# Patient Record
Sex: Female | Born: 1980 | Race: Black or African American | Hispanic: No | Marital: Single | Smoking: Never smoker
Health system: Southern US, Community
[De-identification: ages and names within clinical notes are randomized; demographics above are authoritative.]

## PROBLEM LIST (undated history)

## (undated) DIAGNOSIS — B977 Papillomavirus as the cause of diseases classified elsewhere: Secondary | ICD-10-CM

## (undated) DIAGNOSIS — A6000 Herpesviral infection of urogenital system, unspecified: Secondary | ICD-10-CM

## (undated) DIAGNOSIS — D649 Anemia, unspecified: Secondary | ICD-10-CM

## (undated) HISTORY — DX: Herpesviral infection of urogenital system, unspecified: A60.00

## (undated) HISTORY — DX: Papillomavirus as the cause of diseases classified elsewhere: B97.7

---

## 2013-10-24 ENCOUNTER — Ambulatory Visit: Payer: Self-pay | Admitting: Obstetrics and Gynecology

## 2013-10-24 LAB — CBC WITH DIFFERENTIAL/PLATELET
BASOS ABS: 0.1 10*3/uL (ref 0.0–0.1)
BASOS PCT: 1.2 %
EOS PCT: 1 %
Eosinophil #: 0.1 10*3/uL (ref 0.0–0.7)
HCT: 34.3 % — ABNORMAL LOW (ref 35.0–47.0)
HGB: 11.8 g/dL — AB (ref 12.0–16.0)
Lymphocyte #: 1.8 10*3/uL (ref 1.0–3.6)
Lymphocyte %: 22 %
MCH: 32.6 pg (ref 26.0–34.0)
MCHC: 34.4 g/dL (ref 32.0–36.0)
MCV: 95 fL (ref 80–100)
Monocyte #: 0.4 x10 3/mm (ref 0.2–0.9)
Monocyte %: 5.3 %
Neutrophil #: 5.9 10*3/uL (ref 1.4–6.5)
Neutrophil %: 70.5 %
Platelet: 274 10*3/uL (ref 150–440)
RBC: 3.62 10*6/uL — ABNORMAL LOW (ref 3.80–5.20)
RDW: 14.2 % (ref 11.5–14.5)
WBC: 8.4 10*3/uL (ref 3.6–11.0)

## 2013-10-25 ENCOUNTER — Inpatient Hospital Stay: Payer: Self-pay | Admitting: Obstetrics & Gynecology

## 2013-10-26 LAB — HEMATOCRIT: HCT: 31 % — ABNORMAL LOW (ref 35.0–47.0)

## 2013-10-28 LAB — PATHOLOGY REPORT

## 2013-11-20 ENCOUNTER — Emergency Department: Payer: Self-pay

## 2013-11-20 DIAGNOSIS — B977 Papillomavirus as the cause of diseases classified elsewhere: Secondary | ICD-10-CM

## 2013-11-20 HISTORY — DX: Papillomavirus as the cause of diseases classified elsewhere: B97.7

## 2013-11-20 LAB — CBC WITH DIFFERENTIAL/PLATELET
Basophil #: 0.1 10*3/uL (ref 0.0–0.1)
Basophil %: 0.6 %
EOS PCT: 0.4 %
Eosinophil #: 0 10*3/uL (ref 0.0–0.7)
HCT: 35.2 % (ref 35.0–47.0)
HGB: 11.7 g/dL — ABNORMAL LOW (ref 12.0–16.0)
LYMPHS ABS: 1 10*3/uL (ref 1.0–3.6)
LYMPHS PCT: 10.3 %
MCH: 31.3 pg (ref 26.0–34.0)
MCHC: 33.3 g/dL (ref 32.0–36.0)
MCV: 94 fL (ref 80–100)
Monocyte #: 0.3 x10 3/mm (ref 0.2–0.9)
Monocyte %: 2.7 %
NEUTROS PCT: 86 %
Neutrophil #: 8.5 10*3/uL — ABNORMAL HIGH (ref 1.4–6.5)
Platelet: 358 10*3/uL (ref 150–440)
RBC: 3.74 10*6/uL — AB (ref 3.80–5.20)
RDW: 13.5 % (ref 11.5–14.5)
WBC: 9.9 10*3/uL (ref 3.6–11.0)

## 2013-11-20 LAB — URINALYSIS, COMPLETE
Bacteria: NONE SEEN
Bilirubin,UR: NEGATIVE
GLUCOSE, UR: NEGATIVE mg/dL (ref 0–75)
NITRITE: NEGATIVE
PH: 7 (ref 4.5–8.0)
Protein: NEGATIVE
RBC,UR: 5 /HPF (ref 0–5)
Specific Gravity: 1.017 (ref 1.003–1.030)
Squamous Epithelial: <1

## 2013-11-20 LAB — COMPREHENSIVE METABOLIC PANEL
ALT: 22 U/L (ref 12–78)
ANION GAP: 10 (ref 7–16)
Albumin: 3.9 g/dL (ref 3.4–5.0)
Alkaline Phosphatase: 76 U/L
BILIRUBIN TOTAL: 0.4 mg/dL (ref 0.2–1.0)
BUN: 5 mg/dL — ABNORMAL LOW (ref 7–18)
CALCIUM: 9.2 mg/dL (ref 8.5–10.1)
CO2: 22 mmol/L (ref 21–32)
CREATININE: 0.77 mg/dL (ref 0.60–1.30)
Chloride: 109 mmol/L — ABNORMAL HIGH (ref 98–107)
EGFR (Non-African Amer.): >60
GLUCOSE: 88 mg/dL (ref 65–99)
OSMOLALITY: 278 (ref 275–301)
Potassium: 3.6 mmol/L (ref 3.5–5.1)
SGOT(AST): 21 U/L (ref 15–37)
Sodium: 141 mmol/L (ref 136–145)
TOTAL PROTEIN: 8.1 g/dL (ref 6.4–8.2)

## 2013-11-20 LAB — LIPASE, BLOOD: Lipase: 116 U/L (ref 73–393)

## 2014-01-02 ENCOUNTER — Emergency Department: Payer: Self-pay | Admitting: Emergency Medicine

## 2014-01-02 LAB — COMPREHENSIVE METABOLIC PANEL
ALBUMIN: 4.5 g/dL (ref 3.4–5.0)
ALK PHOS: 76 U/L
AST: 24 U/L (ref 15–37)
Anion Gap: 12 (ref 7–16)
BUN: 4 mg/dL — AB (ref 7–18)
Bilirubin,Total: 0.7 mg/dL (ref 0.2–1.0)
Calcium, Total: 9.7 mg/dL (ref 8.5–10.1)
Chloride: 103 mmol/L (ref 98–107)
Co2: 24 mmol/L (ref 21–32)
Creatinine: 0.78 mg/dL (ref 0.60–1.30)
EGFR (African American): >60
EGFR (Non-African Amer.): >60
Glucose: 96 mg/dL (ref 65–99)
OSMOLALITY: 274 (ref 275–301)
Potassium: 3.1 mmol/L — ABNORMAL LOW (ref 3.5–5.1)
SGPT (ALT): 23 U/L (ref 12–78)
Sodium: 139 mmol/L (ref 136–145)
TOTAL PROTEIN: 8.4 g/dL — AB (ref 6.4–8.2)

## 2014-01-02 LAB — URINALYSIS, COMPLETE
BACTERIA: NONE SEEN
BLOOD: NEGATIVE
Bilirubin,UR: NEGATIVE
Glucose,UR: NEGATIVE mg/dL (ref 0–75)
Leukocyte Esterase: NEGATIVE
Nitrite: NEGATIVE
PH: 7 (ref 4.5–8.0)
PROTEIN: NEGATIVE
RBC,UR: NONE SEEN /HPF (ref 0–5)
SPECIFIC GRAVITY: 1.015 (ref 1.003–1.030)
Squamous Epithelial: <1
WBC UR: 1 /HPF (ref 0–5)

## 2014-01-02 LAB — CBC WITH DIFFERENTIAL/PLATELET
Basophil #: 0.1 10*3/uL (ref 0.0–0.1)
Basophil %: 1.9 %
Eosinophil #: 0.1 10*3/uL (ref 0.0–0.7)
Eosinophil %: 1.3 %
HCT: 39.1 % (ref 35.0–47.0)
HGB: 13.3 g/dL (ref 12.0–16.0)
LYMPHS ABS: 1.7 10*3/uL (ref 1.0–3.6)
Lymphocyte %: 22.1 %
MCH: 30.7 pg (ref 26.0–34.0)
MCHC: 34 g/dL (ref 32.0–36.0)
MCV: 90 fL (ref 80–100)
Monocyte #: 0.3 x10 3/mm (ref 0.2–0.9)
Monocyte %: 3.7 %
Neutrophil #: 5.3 10*3/uL (ref 1.4–6.5)
Neutrophil %: 71 %
Platelet: 400 10*3/uL (ref 150–440)
RBC: 4.34 10*6/uL (ref 3.80–5.20)
RDW: 13.8 % (ref 11.5–14.5)
WBC: 7.5 10*3/uL (ref 3.6–11.0)

## 2014-01-02 LAB — LIPASE, BLOOD: LIPASE: 93 U/L (ref 73–393)

## 2014-12-13 NOTE — Op Note (Signed)
PATIENT NAME:  Sheena Sheena [name], Sheena Sheena [name] MR#:  [id number] DATE OF BIRTH:  1980-10-29  DATE OF PROCEDURE:  10/25/2013  PREOPERATIVE DIAGNOSIS: Previous cesarean.   POSTOPERATIVE DIAGNOSIS:  Multiple fibroids, 1 entangled in the omentum and the anterior abdominal incision.    PROCEDURE: Secondary LUT cesarean section plus myomectomy.   ESTIMATED BLOOD LOSS: 1000 mL.   FINDINGS: Term liveborn female infant with normal tubes and ovaries, anterior fibroid and multiple small fibroids in the posterior aspect.   PROCEDURE:  The patient was taken to the operating room and placed in supine position. After adequate anesthesia was instilled, the patient was prepped and draped in the usual sterile fashion. Pfannenstiel skin incision was made through the old scar and carried sharply down into the fascia. The fascia was nicked in the midline and the curved Mayo scissors were used to cut through the scar tissue that was quite hard. The rectus fascia was nicked and extended in Sheena [name] superolateral manner. Kochers were used to grasp the anterior rectus fascia. The rectus fascia was sharply and bluntly dissected off the rectus muscles both inferiorly and superiorly.  The midline of the muscle bellies was identified, split open, and the peritoneum was grasped with pick-up. The peritoneum was sharply entered with Metzenbaum scissors and the incision was extended superiorly and inferiorly, cutting around the omentum that was attached through the incision and the muscles and wrapped around the fibroid. There were lots of adhesions of the omentum to the anterior uterus. These were all taken down prior to the C-section.  The bladder flap was then created. The bladder blade was placed. Uterine incision was made. Infant was delivered, bulb suctioned and delivered. Cord was clamped and cut and infant was given to the awaiting pediatrician. Pitocin was started.  The interior of the uterus was curetted with Sheena [name] moist lap sponge after the placenta had  delivered. The uterus was then delivered and placed in Sheena [name] moist lap sponge. Adhesions to the anterior abdominal wall were excised. The fibroid was bleeding and Sheena [name] decision was made to remove this. The fibroid was grasped with an Allis clamp and the Bovie was used to cut the surrounding tissue to release the fibroid. Sheena [name] 2-0 Monocryl was used doing locked stitch across the superficial aspect of the myoma field. It was not very deep at all, actually quite superficial. The uterine incision was then closed with Sheena [name] running locked chromic suture and then Sheena [name] running imbricating suture.  Sheena [name] 3-0 Monocryl was used to tack back the bladder to the uterus. The belly was cleared of clots. The uterus was placed back into the abdomen. Interceed was placed over the incision and the left area of anterior uterus. The muscle bellies were approximated with Vicryl in the midline. The on-Q trocars were placed. The catheters were threaded and wrapped around the muscle belly.  The fascia was closed with Sheena [name] running Vicryl suture. Sheena [name] 4-0 Monocryl was used to close the skin edges benzoin and Steri-Strips were placed.  The on-Q was Dermabonded to the skin and Sheena [name] 4 x 4 was placed with Steri-Strips and Tegaderm. The uterus was found to be firm. Clear urine was noted in the Foley bag and the patient was taken to recovery after having tolerated the procedure well.     ____________________________ Elliot Gurneyarrie C. Velencia Lenart, MD cck:mk D: 10/30/2013 00:11:00 ET T: 10/30/2013 00:48:03 ET JOB#: [id number]  cc: Elliot Gurneyarrie C. Lillian Tigges, MD, <Dictator> Elliot GurneyARRIE C Sagan Wurzel MD ELECTRONICALLY SIGNED 11/01/2013 21:59

## 2014-12-13 NOTE — Consult Note (Signed)
PATIENT NAME:  Sheena [name], Adama A MR#:  [id number] DATE OF BIRTH:  01/08/1981  DATE OF CONSULTATION:  01/02/2014  REFERRING PHYSICIAN:  Hillery AldoSarah Patel, MD CONSULTING PHYSICIAN:  Quentin Orealph L. Ely III, MD  CHIEF COMPLAINT: Abdominal pain.   BRIEF HISTORY: Gabriel Cirriameka Barbian is a 34 year old woman with chronic intermittent abdominal pain with an ongoing episode dating back approximately 24 to 36 hours. The pain was primarily suprapubic in the right lower quadrant and mid epigastric. The pain was crampy in nature, but improved over the course of the day initially and then worsened overnight, and she presented to the Emergency Room for further evaluation. She has had several other episodes in the past very similar in nature with bloating and abdominal pain, all of which lasted 8 to 12 hours, then resolved. This current episode did not resolve and thus precipitated her Emergency Room evaluation. She denies a history of hepatitis, yellow jaundice, pancreatitis, peptic ulcer disease, diagnosis of gallbladder disease or diverticulitis. Only previous surgery has been 2 C-sections. She actually had a C-section 2 months ago with delivery of healthy baby boy. She denies any other GI complaints. She does have history of significant menstrual cramps. She has no cardiac disease, hypertension, diabetes or thyroid problems. She does not smoke cigarettes and does not drink any alcohol. She is regularly followed at the [facility].   MEDICATIONS:  She has no current medications.   ALLERGIES: She is not allergic to any medications.   REVIEW OF SYSTEMS: Otherwise unremarkable.   Work-up in the Emergency Room normal laboratory values, but CT scan demonstrated, what appeared to be, a possible enlarged thickened appendix, although it was difficult to connect the tubular structure to the cecum. Initial reading was in the appropriate clinical situation. The CT findings could represent acute appendicitis.   PHYSICAL EXAMINATION:   GENERAL: She is alert, pleasant, comfortable, lying in bed without significant distress. She has no abdominal pain at the present time.  VITAL SIGNS: Blood pressure 140/70, pulse rate 60, respiratory rate is 18, and she is afebrile and oxygen saturation is satisfactory. HEENT: No scleral icterus. No pupillary abnormalities. No facial deformities.  NECK: Supple and nontender with a midline trachea and no adenopathy.  CHEST: Clear with normal pulmonary excursion. No adventitious sounds noted.  CARDIAC: No murmurs or gallops to my ear and she has been in normal sinus rhythm.  ABDOMEN: Soft with minimal midepigastric and suprapubic tenderness. She has no right lower quadrant tenderness. She has no rebound, no guarding and no hernias. She has well-healed suprapubic scar.  EXTREMITIES: Good distal pulses. Full range of motion.   I have independently reviewed her CT scan. There does not appear to be any evidence of significant intra-abdominal fluid. There is a tubular structure in the pelvis, which is hard to connect to the cecum, but could definitely represent an enlarged appendix. I do not have any other studies to compare to. However, she is not markedly uncomfortable. Her symptoms have improved dramatically since she has been hospitalized. At the present time, she does not wish to consider any surgical intervention. It think that is certainly reasonable to her clinical scenario does not suggests acute appendicitis. We gave her our contact information. Should her symptoms worsen, we asked her to contact our office for further evaluation. Laparoscopy would be the next diagnostic choice.. She is in agreement with this plan. ____________________________ Carmie Endalph L. Ely III, MD rle:aw D: 01/02/2014 13:39:32 ET T: 01/02/2014 14:24:50 ET JOB#: [id number]  cc: Quentin Orealph L. Ely  III, MD, <Dictator> [facility] Quentin Ore MD ELECTRONICALLY SIGNED 01/08/2014 0:09

## 2015-12-24 LAB — HM PAP SMEAR: HM Pap smear: NORMAL

## 2016-05-17 ENCOUNTER — Other Ambulatory Visit: Payer: Self-pay | Admitting: Obstetrics and Gynecology

## 2016-05-17 DIAGNOSIS — Z1231 Encounter for screening mammogram for malignant neoplasm of breast: Secondary | ICD-10-CM

## 2016-06-06 ENCOUNTER — Encounter: Payer: Self-pay | Admitting: Radiology

## 2016-06-06 ENCOUNTER — Ambulatory Visit
Admission: RE | Admit: 2016-06-06 | Discharge: 2016-06-06 | Disposition: A | Discharge status: Home / Self Care | Payer: PRIVATE HEALTH INSURANCE | Source: Ambulatory Visit | Attending: Obstetrics and Gynecology | Admitting: Obstetrics and Gynecology

## 2016-06-06 DIAGNOSIS — Z1231 Encounter for screening mammogram for malignant neoplasm of breast: Secondary | ICD-10-CM | POA: Insufficient documentation

## 2016-06-07 ENCOUNTER — Other Ambulatory Visit: Payer: Self-pay | Admitting: *Deleted

## 2016-06-07 ENCOUNTER — Inpatient Hospital Stay
Admission: RE | Admit: 2016-06-07 | Discharge: 2016-06-07 | Disposition: A | Discharge status: Home / Self Care | Payer: Self-pay | Source: Ambulatory Visit | Attending: *Deleted | Admitting: *Deleted

## 2016-06-07 DIAGNOSIS — Z9289 Personal history of other medical treatment: Secondary | ICD-10-CM

## 2017-03-24 ENCOUNTER — Telehealth: Payer: Self-pay

## 2017-03-24 DIAGNOSIS — N946 Dysmenorrhea, unspecified: Secondary | ICD-10-CM

## 2017-03-24 NOTE — Telephone Encounter (Signed)
Pt calling for refill of Ibuprophen 800mg  for menstrual pain.  [phone].  Called pt to see if it would be cheaper for her to get it otc and take four at a time.  She says she'd rather not.  She doesn't like to take so many pills.

## 2017-03-28 DIAGNOSIS — N946 Dysmenorrhea, unspecified: Secondary | ICD-10-CM | POA: Insufficient documentation

## 2017-03-28 MED ORDER — IBUPROFEN 800 MG PO TABS: 800.0000 mg | ORAL_TABLET | Freq: Three times a day (TID) | ORAL | 1 refills | Status: DC | PRN

## 2017-03-28 NOTE — Telephone Encounter (Signed)
Rx sent in, as per patient request

## 2017-12-27 ENCOUNTER — Ambulatory Visit: Payer: Self-pay | Admitting: Advanced Practice Midwife

## 2018-05-22 ENCOUNTER — Ambulatory Visit: Payer: Medicaid Other | Admitting: Maternal Newborn

## 2019-03-06 ENCOUNTER — Telehealth: Payer: Self-pay

## 2019-03-06 ENCOUNTER — Other Ambulatory Visit: Payer: Self-pay

## 2019-03-06 ENCOUNTER — Encounter: Payer: Self-pay | Admitting: Advanced Practice Midwife

## 2019-03-06 ENCOUNTER — Other Ambulatory Visit (HOSPITAL_COMMUNITY)
Admission: RE | Admit: 2019-03-06 | Discharge: 2019-03-06 | Disposition: A | Discharge status: Home / Self Care | Payer: PRIVATE HEALTH INSURANCE | Source: Ambulatory Visit | Attending: Advanced Practice Midwife | Admitting: Advanced Practice Midwife

## 2019-03-06 ENCOUNTER — Ambulatory Visit (INDEPENDENT_AMBULATORY_CARE_PROVIDER_SITE_OTHER): Payer: PRIVATE HEALTH INSURANCE | Admitting: Advanced Practice Midwife

## 2019-03-06 VITALS — BP 118/78 | Ht 65.0 in | Wt 150.0 lb

## 2019-03-06 DIAGNOSIS — N946 Dysmenorrhea, unspecified: Secondary | ICD-10-CM

## 2019-03-06 DIAGNOSIS — Z01419 Encounter for gynecological examination (general) (routine) without abnormal findings: Secondary | ICD-10-CM | POA: Insufficient documentation

## 2019-03-06 DIAGNOSIS — N920 Excessive and frequent menstruation with regular cycle: Secondary | ICD-10-CM

## 2019-03-06 DIAGNOSIS — Z124 Encounter for screening for malignant neoplasm of cervix: Secondary | ICD-10-CM

## 2019-03-06 MED ORDER — NORETHINDRONE 0.35 MG PO TABS: ORAL_TABLET | ORAL | 11 refills | Status: DC

## 2019-03-06 MED ORDER — KETOPROFEN 50 MG PO CAPS: 50.0000 mg | ORAL_CAPSULE | Freq: Four times a day (QID) | ORAL | 5 refills | Status: DC | PRN

## 2019-03-06 NOTE — Telephone Encounter (Signed)
Pt calling to see if [facility] sent in pain medication.  [phone]  Pt aware it has been called in.

## 2019-03-06 NOTE — Progress Notes (Signed)
Gynecology Annual Exam   PCP: Patient, No Pcp Per  Chief Complaint:  Chief Complaint  Patient presents with  . Annual Exam    History of Present Illness: Patient is a 38 y.o. G2P2002 presents for annual exam. The patient has complaints today of painful and frequent menstruation. Her period is regular every 17-19 days and lasts for 6-7 days. It is of moderate flow. She has severe pain on the first day of the cycle that keeps her out of work. The pain starts in her left lower abdomen and radiates into her thigh and throughout her abdomen and is accompanied by bloating.  Her first comment was to say she wants to have a hysterectomy. We discussed that for a few minutes and then she moved on to wanting pain medicine that is stronger than ibuprofen which she reports does not work and also a medication that can delay her cycle by a few days so she does not miss work.    LMP: Patient's last menstrual period was 02/10/2019.  Clots: no Intermenstrual Bleeding: no Postcoital Bleeding: not applicable Dysmenorrhea: yes  The patient is not currently sexually active and says she has no plans to ever be sexually active again. She currently uses abstinence for contraception. She denies dyspareunia.  The patient does perform self breast exams.  There is no notable family history of breast or ovarian cancer in her family. Her mom was diagnosed with breast cancer at age 42.  The patient wears seatbelts: yes.   The patient has regular exercise: she does cardio and strength workouts. She eats a healthy diet and admits drinking mostly water. She has an occasional pepsi. She admits adequate sleep.    The patient denies current symptoms of depression.    Review of Systems: Review of Systems  Constitutional: Negative.   HENT: Negative.   Eyes: Negative.   Respiratory: Negative.   Cardiovascular: Negative.   Gastrointestinal: Negative.   Genitourinary: Negative.   Musculoskeletal: Negative.   Skin:  Negative.   Neurological: Negative.   Endo/Heme/Allergies: Negative.   Psychiatric/Behavioral: Negative.     Past Medical History:  Past Medical History:  Diagnosis Date  . Herpes, genital   . 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 CESAREAN SECTION  2005; 2015   [facility] x2    Gynecologic History:  Patient's last menstrual period was 02/10/2019. Contraception: abstinence Last Pap: 3 years ago Results were: no abnormalities   Obstetric History: [id number]  Family History:  Family History  Problem Relation Age of Onset  . Breast cancer Mother 76  . Cancer Father 61       colon    Social History:  Social History   Socioeconomic History  . Marital status: Single    Spouse name: Not on file  . Number of children: 2  . Years of education: Not on file  . Highest education level: Not on file  Occupational History  . Not on file  Social Needs  . Financial resource strain: Not on file  . Food insecurity    Worry: Not on file    Inability: Not on file  . Transportation needs    Medical: Not on file    Non-medical: Not on file  Tobacco Use  . Smoking status: Never Smoker  . Smokeless tobacco: Never Used  Substance and Sexual Activity  . Alcohol use: Never  Frequency: Never  . Drug use: Never  . Sexual activity: Not Currently  Lifestyle  . Physical activity    Days per week: Not on file    Minutes per session: Not on file  . Stress: Not on file  Relationships  . Social Musicianconnections    Talks on phone: Not on file    Gets together: Not on file    Attends religious service: Not on file    Active member of club or organization: Not on file    Attends meetings of clubs or organizations: Not on file    Relationship status: Not on file  . Intimate partner violence    Fear of current or ex partner: Not on file    Emotionally abused: Not on file    Physically abused: Not on file     Forced sexual activity: Not on file  Other Topics Concern  . Not on file  Social History Narrative  . Not on file    Allergies:  No Known Allergies  Medications: Prior to Admission medications   Medication Sig Start Date End Date Taking? Authorizing Provider  ketoprofen (ORUDIS) 50 MG capsule Take 1 capsule (50 mg total) by mouth 4 (four) times daily as needed. 03/06/19   Tresea MallGledhill, Cadin Luka, CNM  norethindrone (MICRONOR) 0.35 MG tablet Take 3 tablets daily for 3-4 days before expected period to delay cycle 03/06/19   Tresea MallGledhill, Sienna Stonehocker, CNM    Physical Exam Vitals: Blood pressure 118/78, height 5\' 5"  (1.651 m), weight 150 lb (68 kg), last menstrual period 02/10/2019.  General: NAD HEENT: normocephalic, anicteric Thyroid: no enlargement, no palpable nodules Pulmonary: No increased work of breathing, CTAB Cardiovascular: RRR, distal pulses 2+ Breast: Breast symmetrical, no tenderness, no palpable nodules or masses, no skin or nipple retraction present, no nipple discharge.  No axillary or supraclavicular lymphadenopathy. Abdomen: NABS, soft, non-tender, non-distended.  Umbilicus without lesions.  No hepatomegaly, splenomegaly or masses palpable. No evidence of hernia  Genitourinary:  External: Normal external female genitalia.  Normal urethral meatus, normal Bartholin's and Skene's glands.    Vagina: Normal vaginal mucosa, no evidence of prolapse.    Cervix: Grossly normal in appearance, no bleeding, no CMT  Uterus: Non-enlarged, mobile, normal contour.    Adnexa: ovaries non-enlarged, no adnexal masses,mildly tender to palpation, left side slightly more tender than left  Rectal: deferred  Lymphatic: no evidence of inguinal lymphadenopathy Extremities: no edema, erythema, or tenderness Neurologic: Grossly intact Psychiatric: mood appropriate, affect full   Assessment: 38 y.o. G2P2002 routine annual exam  Plan: Problem List Items Addressed This Visit    None    Visit Diagnoses     Well woman exam with routine gynecological exam    -  Primary   Relevant Orders   Cytology - PAP   Cervical cancer screening       Relevant Orders   Cytology - PAP   Frequent menstruation       Relevant Medications   norethindrone (MICRONOR) 0.35 MG tablet   Painful menstruation       Relevant Medications   ketoprofen (ORUDIS) 50 MG capsule      1) STI screening  was offered and declined  2)  ASCCP guidelines and rationale discussed.  Patient opts for every 3 years screening interval  3) Contraception - the patient is currently using  abstinence.  She is happy with her current form of contraception and plans to continue  4) Routine healthcare maintenance including cholesterol, diabetes screening discussed Declines  5) For annual exam and For schedule visit with Dr Bonney AidStaebler to discuss options regarding hysterectomy   Tresea MallJane Danah Reinecke, CNM [facility] 03/06/2019, 3:53 PM

## 2019-03-08 LAB — CYTOLOGY - PAP
Diagnosis: NEGATIVE
HPV: NOT DETECTED

## 2019-07-05 ENCOUNTER — Telehealth: Payer: Self-pay

## 2019-07-05 NOTE — Telephone Encounter (Signed)
She can try heating pad, go back to taking ibuprofen. She should follow up with MD if she needs further guidance. She may need surgery or other MD help. If she wants to consider hormones we can try that.

## 2019-07-05 NOTE — Telephone Encounter (Signed)
Pain medication is not working anymore, pt states she has to take at least 3 pills to help with pain. Can something else be called in? Please advise.

## 2019-07-08 NOTE — Telephone Encounter (Signed)
Called pt, no answer, could not leave voice msg. 

## 2019-07-08 NOTE — Telephone Encounter (Signed)
Patient returning call.

## 2019-07-08 NOTE — Telephone Encounter (Signed)
Please call pt to schedule appt with MD. Pt says she has been dealing with this pain for the past 15 yrs and no heating pad or ibuprofen will help. Does not want to try hormones either.

## 2019-07-25 ENCOUNTER — Encounter: Payer: Self-pay | Admitting: Obstetrics and Gynecology

## 2019-07-25 ENCOUNTER — Other Ambulatory Visit: Payer: Self-pay

## 2019-07-25 ENCOUNTER — Ambulatory Visit (INDEPENDENT_AMBULATORY_CARE_PROVIDER_SITE_OTHER): Payer: PRIVATE HEALTH INSURANCE | Admitting: Obstetrics and Gynecology

## 2019-07-25 VITALS — BP 124/78 | Ht 65.0 in | Wt 151.0 lb

## 2019-07-25 DIAGNOSIS — N946 Dysmenorrhea, unspecified: Secondary | ICD-10-CM

## 2019-07-25 MED ORDER — MEFENAMIC ACID 250 MG PO CAPS: ORAL_CAPSULE | ORAL | 11 refills | Status: DC

## 2019-07-25 NOTE — Progress Notes (Addendum)
Gynecology Abnormal Uterine Bleeding Initial Evaluation   Chief Complaint:  Chief Complaint  Patient presents with  . Pelvic Pain  . Menstrual Problem    History of Present Illness:    Sheena [name] is a 38 y.o. L3Y1017 who LMP was Patient's last menstrual period was 07/05/2019., presents today for a problem visit.  She complains of regular but painful period that date back to her teens.  She reports menarche at age 64, first two years were relatively normal before developing progressive dysmenorrhea.  She has short term interval relief after bother of her pregnancies.  She is not currently sexually active but denies prior issues with dyspareunia.  No rectal pain.  The pain is confined to first day of menses, it is sharp, she does have radiation into the groin and thighs.  Outside of menses she is symptoms free.   Her menstrual flow is otherwise normal although her cycle interval is slightly shorter at approximately 21 days. Duration normal at approximately 7 days.  She does have a surgical history significant for 2 cesarean sections with myomectomy at the time of C-section in 2015.   Last Pap results esults were obtained 03/06/2019 NIL and HR HPV negative   Previous evaluation: none Previous diagnosis: Primary dysmenorrhea Previous Treatment: Tylenol, Tyelnol #3, Ketoprofen, ibuprofen.  Last imaging CT abdomen/pelvis 01/03/2019 for appendicitis notable findings included hepatomegaly, cardiomegaly, normal gynecologic imaging  LMP: Patient's last menstrual period was 07/05/2019.  Review of Systems: Review of Systems  Constitutional: Negative.   Gastrointestinal: Negative.   Genitourinary: Negative.   Endo/Heme/Allergies: Does not bruise/bleed easily.    Past Medical History:  Past Medical History:  Diagnosis Date  . Herpes, genital   . 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 CESAREAN SECTION   2005; 2015   [facility] x2    Obstetric History: G2P2002  Family History:  Family History  Problem Relation Age of Onset  . Breast cancer Mother 56  . Cancer Father 15       colon    Social History:  Social History   Socioeconomic History  . Marital status: Single    Spouse name: Not on file  . Number of children: 2  . Years of education: Not on file  . Highest education level: Not on file  Occupational History  . Not on file  Social Needs  . Financial resource strain: Not on file  . Food insecurity    Worry: Not on file    Inability: Not on file  . Transportation needs    Medical: Not on file    Non-medical: Not on file  Tobacco Use  . Smoking status: Never Smoker  . Smokeless tobacco: Never Used  Substance and Sexual Activity  . Alcohol use: Never    Frequency: Never  . Drug use: Never  . Sexual activity: Not Currently    Birth control/protection: None  Lifestyle  . Physical activity    Days per week: Not on file    Minutes per session: Not on file  . Stress: Not on file  Relationships  . Social Musician on phone: Not on file    Gets together: Not on file    Attends religious service: Not on file    Active member of club or organization: Not on file    Attends meetings of clubs or organizations: Not on file  Relationship status: Not on file  . Intimate partner violence    Fear of current or ex partner: Not on file    Emotionally abused: Not on file    Physically abused: Not on file    Forced sexual activity: Not on file  Other Topics Concern  . Not on file  Social History Narrative  . Not on file    Allergies:  No Known Allergies  Medications: Prior to Admission medications   Medication Sig Start Date End Date Taking? Authorizing Provider  ketoprofen (ORUDIS) 50 MG capsule Take 1 capsule (50 mg total) by mouth 4 (four) times daily as needed. Patient not taking: Reported on 07/25/2019 03/06/19   Rod Can, CNM  Mefenamic Acid  (PONSTEL) 250 MG CAPS 2 capsules (500 mg) po once and the onset of menses, then 1 capsule (250 mg) every 6 hours prn cramping max 3 days 07/25/19   Malachy Mood, MD  norethindrone (MICRONOR) 0.35 MG tablet Take 3 tablets daily for 3-4 days before expected period to delay cycle Patient not taking: Reported on 07/25/2019 03/06/19   Rod Can, CNM    Physical Exam Blood pressure 124/78, height 5\' 5"  (1.651 m), weight 151 lb (68.5 kg), last menstrual period 07/05/2019.  Patient's last menstrual period was 07/05/2019.  General: NAD, well nourished appears stated age 6: normocephalic, anicteric Pulmonary: No increased work of breathing Neurologic: Grossly intact Psychiatric: mood appropriate, affect full  Female chaperone present for pelvic portions of the physical exam  Assessment: 38 y.o. [id number] with abnormal uterine bleeding  Plan: Problem List Items Addressed This Visit      Genitourinary   Dysmenorrhea - Primary   Relevant Orders   US Transvaginal Non-OB      1) Discussed management options for dysmenorrhea including expectant, NSAIDs, oral progesterone (Provera, norethindrone, megace), Depo Provera, and Levonorgestrel containing IUD.  DDx includes primary dysmenorrhea, uterine fibroids, scarring from prior myomectomy/C-section, adenomyosis, and endometriosis - trial of ponstel for primary dysmenorrhea - ultrasound to evaluate for uterine structural abnormalities and adenomyosis - if inadequate response to ponstel discussed empiric treatment for endometriosis vs diagnostic laparoscopy   2) A total of  20 minutes were spent in face-to-face contact with the patient during this encounter with over half of that time devoted to counseling and coordination of care. Prior imaging and operative records independently reviewed.  3) Return in about 2 weeks (around 08/08/2019) for TVUS and follow up.   Malachy Mood, MD, [facility], [facility]  07/25/2019, 4:52 PM

## 2019-08-19 ENCOUNTER — Other Ambulatory Visit: Payer: PRIVATE HEALTH INSURANCE

## 2019-08-19 ENCOUNTER — Ambulatory Visit: Payer: PRIVATE HEALTH INSURANCE | Admitting: Obstetrics and Gynecology

## 2019-09-09 ENCOUNTER — Encounter: Payer: Self-pay | Admitting: Obstetrics and Gynecology

## 2019-09-09 ENCOUNTER — Ambulatory Visit (INDEPENDENT_AMBULATORY_CARE_PROVIDER_SITE_OTHER): Payer: PRIVATE HEALTH INSURANCE

## 2019-09-09 ENCOUNTER — Other Ambulatory Visit: Payer: Self-pay

## 2019-09-09 ENCOUNTER — Ambulatory Visit (INDEPENDENT_AMBULATORY_CARE_PROVIDER_SITE_OTHER): Payer: PRIVATE HEALTH INSURANCE | Admitting: Obstetrics and Gynecology

## 2019-09-09 VITALS — BP 102/70 | HR 91 | Ht 65.0 in | Wt 148.0 lb

## 2019-09-09 DIAGNOSIS — N946 Dysmenorrhea, unspecified: Secondary | ICD-10-CM

## 2019-09-09 MED ORDER — ORILISSA 150 MG PO TABS: 1.0000 | ORAL_TABLET | Freq: Every day | ORAL | 5 refills | Status: DC

## 2019-09-09 NOTE — Progress Notes (Signed)
Gynecology Ultrasound Follow Up  Chief Complaint:  Chief Complaint  Patient presents with  . Follow-up    GYN Ultrasound     History of Present Illness: Patient is a 39 y.o. female who presents today for ultrasound evaluation of dysmenorrhea.  The patient has trial ponstel in the interim without meaningful improvement in her symptoms.  Ultrasound demonstrates the following findgins Adnexa: no adnexal masses Uterus: Non-enlarged with homogenous endometrial strip devoid of focal abnormalities Additional: no free fluid  Review of Systems: Review of Systems  Constitutional: Negative.   Gastrointestinal: Negative.   Genitourinary: Negative.     Past Medical History:  Past Medical History:  Diagnosis Date  . Herpes, genital   . 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 CESAREAN SECTION  2005; 2015   [facility] x2    Gynecologic History:  Patient's last menstrual period was 08/21/2019.  Family History:  Family History  Problem Relation Age of Onset  . Breast cancer Mother 82  . Cancer Father 58       colon    Social History:  Social History   Socioeconomic History  . Marital status: Single    Spouse name: Not on file  . Number of children: 2  . Years of education: Not on file  . Highest education level: Not on file  Occupational History  . Not on file  Tobacco Use  . Smoking status: Never Smoker  . Smokeless tobacco: Never Used  Substance and Sexual Activity  . Alcohol use: Never  . Drug use: Never  . Sexual activity: Not Currently    Birth control/protection: None  Other Topics Concern  . Not on file  Social History Narrative  . Not on file   Social Determinants of Health   Financial Resource Strain:   . Difficulty of Paying Living Expenses: Not on file  Food Insecurity:   . Worried About Programme researcher, broadcasting/film/video in the Last Year: Not on file  . Ran Out of Food in the  Last Year: Not on file  Transportation Needs:   . Lack of Transportation (Medical): Not on file  . Lack of Transportation (Non-Medical): Not on file  Physical Activity:   . Days of Exercise per Week: Not on file  . Minutes of Exercise per Session: Not on file  Stress:   . Feeling of Stress : Not on file  Social Connections:   . Frequency of Communication with Friends and Family: Not on file  . Frequency of Social Gatherings with Friends and Family: Not on file  . Attends Religious Services: Not on file  . Active Member of Clubs or Organizations: Not on file  . Attends Banker Meetings: Not on file  . Marital Status: Not on file  Intimate Partner Violence:   . Fear of Current or Ex-Partner: Not on file  . Emotionally Abused: Not on file  . Physically Abused: Not on file  . Sexually Abused: Not on file    Allergies:  No Known Allergies  Medications: Prior to Admission medications   Medication Sig Start Date End Date Taking? Authorizing Provider  Elagolix Sodium (ORILISSA) 150 MG TABS Take 1 tablet by mouth daily. 09/09/19   Vena Austria, MD    Physical Exam Vitals: Blood pressure 102/70, pulse 91, height 5\' 5"  (1.651 m), weight 148 lb (67.1 kg), last menstrual period 08/21/2019.  General:  NAD HEENT: normocephalic, anicteric Pulmonary: No increased work of breathing Extremities: no edema, erythema, or tenderness Neurologic: Grossly intact, normal gait Psychiatric: mood appropriate, affect full  US Transvaginal Non-OB  Result Date: 09/09/2019 Patient Name: REISHA WOS DOB: [birth year] MRN: [id number] ULTRASOUND REPORT Location: [facility] Date of Service: 09/09/2019 Indications: dysmenorrhea Findings: The uterus is anteverted and measures 10.1 x 6.1 x 4.8 cm. Echo texture is homogenous without evidence of focal masses. The Endometrium measures 10.1 mm. Right Ovary measures 3.7 x 3.6 x 2.2 cm. It is normal in appearance. Left Ovary measures 2.9 x 2.1 x 1.5  cm. It is normal in appearance. Survey of the adnexa demonstrates no adnexal masses. There is no free fluid in the cul de sac. Impression: 1. Normal pelvic ultrasound. Recommendations: 1.Clinical correlation with the patient's History and Physical Exam. Gweneth Dimitri, RT Images reviewed.  Normal GYN study without visualized pathology.  Malachy Mood, MD, [facility], [facility] 09/09/2019, 3:44 PM     Assessment: 39 y.o. G2P2002 with primary dysmenorrhea  Plan: Problem List Items Addressed This Visit      Genitourinary   Dysmenorrhea - Primary      1) Dysmenorrhea - failed trial of ponstel no significant improvement.  Based on normal imaging main concern with for endometriosis.  Patient opts for empiric treatment with Freida Busman as opposed to proceeding with diagnostic laparoscopy.  Component of adenomyosis and or pelvic congestion remain in differential.    2) A total of 15 minutes were spent in face-to-face contact with the patient during this encounter with over half of that time devoted to counseling and coordination of care.  3) Return in about 7 weeks (around 10/28/2019) for 7-8 weeks medication follow up phone/video/or in office ok.    Malachy Mood, MD, [facility], [facility] 09/09/2019, 4:03 PM

## 2019-09-10 ENCOUNTER — Telehealth: Payer: Self-pay

## 2019-09-10 NOTE — Telephone Encounter (Signed)
Prior Authorization for Sheena [name] has been started today through CoverMyMeds. I will update pt on status as I am updated

## 2019-09-18 NOTE — Telephone Encounter (Signed)
Sheena [name] called for primary dx and code.  Given.

## 2019-09-18 NOTE — Telephone Encounter (Signed)
N80.3 endometriosis and N94.6 dysmenorrhea

## 2019-10-04 ENCOUNTER — Telehealth: Payer: Self-pay

## 2019-10-04 NOTE — Telephone Encounter (Signed)
I have been speaking to Lacretia Nicks, Dewayne Hatch Rep, regarding coverage for medication. The Rx is now going through the [employer]. Pt aware to reach out to the program and give additional information.

## 2019-12-02 NOTE — Telephone Encounter (Signed)
Pt aware Dewayne Hatch has been approved for medication assistance through [employer]bbVie.  Rx will be resent to pt's pharmacy

## 2020-04-29 ENCOUNTER — Ambulatory Visit: Payer: PRIVATE HEALTH INSURANCE | Admitting: Obstetrics and Gynecology

## 2020-07-06 ENCOUNTER — Ambulatory Visit (INDEPENDENT_AMBULATORY_CARE_PROVIDER_SITE_OTHER): Payer: PRIVATE HEALTH INSURANCE | Admitting: Obstetrics and Gynecology

## 2020-07-06 ENCOUNTER — Encounter: Payer: Self-pay | Admitting: Obstetrics and Gynecology

## 2020-07-06 ENCOUNTER — Other Ambulatory Visit: Payer: Self-pay

## 2020-07-06 VITALS — BP 130/82 | Ht 65.0 in | Wt 143.0 lb

## 2020-07-06 DIAGNOSIS — Z1211 Encounter for screening for malignant neoplasm of colon: Secondary | ICD-10-CM | POA: Diagnosis not present

## 2020-07-06 DIAGNOSIS — Z01419 Encounter for gynecological examination (general) (routine) without abnormal findings: Secondary | ICD-10-CM | POA: Diagnosis not present

## 2020-07-06 DIAGNOSIS — Z1239 Encounter for other screening for malignant neoplasm of breast: Secondary | ICD-10-CM

## 2020-07-06 MED ORDER — NORETHINDRONE ACETATE 5 MG PO TABS: 5.0000 mg | ORAL_TABLET | Freq: Every day | ORAL | 11 refills | Status: DC

## 2020-07-06 NOTE — Progress Notes (Signed)
Gynecology Annual Exam  PCP: Sheena [name], No Pcp Per  Chief Complaint:  Chief Complaint  Sheena [name] presents with  . Gynecologic Exam    History of Present Illness: Sheena [name] is a 39 y.o. [id number] presents for annual exam. The Sheena [name] has no complaints today.   LMP: Sheena [name]'s last menstrual period was 06/23/2020 (approximate).   The Sheena [name] is sexually active.  She has dyspareunia.  The Sheena [name] does perform self breast exams.  There is no notable family history of breast or ovarian cancer in her family.  The Sheena [name] wears seatbelts: yes.   The Sheena [name] has regular exercise: not asked.    The Sheena [name] denies current symptoms of depression.    Sheena [name] has minimal improvement in pain on [place].  Went through [place] Sheena [name] assistance program.  She also continued to have irregular vaginal bleeding, menses maybe a week later but otherwise no improvement in bleeding profile.  Review of Systems: Review of Systems  Constitutional: Negative for chills and fever.  HENT: Negative for congestion.   Respiratory: Negative for cough and shortness of breath.   Cardiovascular: Negative for chest pain and palpitations.  Gastrointestinal: Positive for abdominal pain. Negative for constipation, diarrhea, heartburn, nausea and vomiting.  Genitourinary: Negative for dysuria, frequency and urgency.  Skin: Negative for itching and rash.  Neurological: Negative for dizziness and headaches.  Endo/Heme/Allergies: Negative for polydipsia.  Psychiatric/Behavioral: Negative for depression.    Past Medical History:  Sheena [name] Active Problem List   Diagnosis Date Noted  . Dysmenorrhea 03/28/2017    Past Surgical History:  Past Surgical History:  Procedure Laterality Date  . CESAREAN SECTION  2005; 2015   [facility] x2    Gynecologic History:  Sheena [name]'s last menstrual period was 06/23/2020 (approximate). Last Pap: Results were: 03/05/2020 NIL and HR HPV negative  Obstetric History: [id number]  Family History:   Family History  Problem Relation Age of Onset  . Breast cancer Mother 32  . Cancer Father 2       colon    Social History:  Social History   Socioeconomic History  . Marital status: Single    Spouse name: Not on file  . Number of children: 2  . Years of education: Not on file  . Highest education level: Not on file  Occupational History  . Not on file  Tobacco Use  . Smoking status: Never Smoker  . Smokeless tobacco: Never Used  Vaping Use  . Vaping Use: Never used  Substance and Sexual Activity  . Alcohol use: Never  . Drug use: Never  . Sexual activity: Not Currently    Birth control/protection: None  Other Topics Concern  . Not on file  Social History Narrative  . Not on file   Social Determinants of Health   Financial Resource Strain:   . Difficulty of Paying Living Expenses: Not on file  Food Insecurity:   . Worried About Programme researcher, broadcasting/film/video in the Last Year: Not on file  . Ran Out of Food in the Last Year: Not on file  Transportation Needs:   . Lack of Transportation (Medical): Not on file  . Lack of Transportation (Non-Medical): Not on file  Physical Activity:   . Days of Exercise per Week: Not on file  . Minutes of Exercise per Session: Not on file  Stress:   . Feeling of Stress : Not on file  Social Connections:   . Frequency of Communication with Friends and Family: Not on file  . Frequency of Social  Gatherings with Friends and Family: Not on file  . Attends Religious Services: Not on file  . Active Member of Clubs or Organizations: Not on file  . Attends Banker Meetings: Not on file  . Marital Status: Not on file  Intimate Partner Violence:   . Fear of Current or Ex-Partner: Not on file  . Emotionally Abused: Not on file  . Physically Abused: Not on file  . Sexually Abused: Not on file    Allergies:  No Known Allergies  Medications: Prior to Admission medications   Medication Sig Start Date End Date Taking? Authorizing  Provider  Elagolix Sodium (ORILISSA) 150 MG TABS Take 1 tablet by mouth daily. 09/09/19  Yes Vena Austria, MD    Physical Exam Vitals: Blood pressure 130/82, height 5\' 5"  (1.651 m), weight 143 lb (64.9 kg), last menstrual period 06/23/2020.  General: NAD HEENT: normocephalic, anicteric Thyroid: no enlargement, no palpable nodules Pulmonary: No increased work of breathing, CTAB Cardiovascular: RRR, distal pulses 2+ Breast: Breast symmetrical, no tenderness, no palpable nodules or masses, no skin or nipple retraction present, no nipple discharge.  No axillary or supraclavicular lymphadenopathy. Abdomen: NABS, soft, non-tender, non-distended.  Umbilicus without lesions.  No hepatomegaly, splenomegaly or masses palpable. No evidence of hernia  Genitourinary:  External: Normal external female genitalia.  Normal urethral meatus, normal Bartholin's and Skene's glands.    Vagina: Normal vaginal mucosa, no evidence of prolapse.    Cervix: Grossly normal in appearance, no bleeding  Uterus: Non-enlarged, mobile, normal contour.  No CMT  Adnexa: ovaries non-enlarged, no adnexal masses  Rectal: deferred  Lymphatic: no evidence of inguinal lymphadenopathy Extremities: no edema, erythema, or tenderness Neurologic: Grossly intact Psychiatric: mood appropriate, affect full  Female chaperone present for pelvic and breast  portions of the physical exam   There is no immunization history on file for this Sheena [name].   Assessment: 39 y.o. 40 routine annual exam  Plan: Problem List Items Addressed This Visit    None      1) Mammogram - recommend yearly screening mammogram.  Mammogram Was ordered today   2) STI screening  was notoffered and therefore not obtained  3) ASCCP guidelines and rational discussed.  Sheena [name] opts for every 3 years screening interval  4) Endometriosis - inadequate response to [id number].  Rx norethindrone follow up response in 3 months.   Consider addition of  Letrozole.  Discussed lupron, diagnostic laparosocpy/hystrectomy.  5) Colonoscopy -- Screening recommended starting at age 85 for average risk individuals, age 5 for individuals deemed at increased risk (including African Americans) and recommended to continue until age 27.  For Sheena [name] age 25-85 individualized approach is recommended.  Gold standard screening is via colonoscopy, Cologuard screening is an acceptable alternative for Sheena [name] unwilling or unable to undergo colonoscopy.  "Colorectal cancer screening for average?risk adults: 2018 guideline update from the American Cancer Society"CA: A Cancer Journal for Clinicians: Jan 18, 2017   6) Routine healthcare maintenance including cholesterol, diabetes screening discussed managed by PCP  7) No follow-ups on file.   January 20, 2017, MD, Vena Austria [facility], [facility] 07/06/2020, 4:02 PM

## 2020-07-06 NOTE — Patient Instructions (Signed)
[facility] [address] [place] [address]  [facility]  [address] [place] [address]  Phone: [phone]  

## 2020-07-06 NOTE — Progress Notes (Signed)
Annual

## 2020-07-07 ENCOUNTER — Telehealth: Payer: Self-pay

## 2020-07-07 NOTE — Telephone Encounter (Signed)
Pt states the medicine(Norethindrone) AMS prescribed her yesterday, she has already tried that and had a bad reaction, can AMS send in something for pain?

## 2020-07-08 ENCOUNTER — Other Ambulatory Visit: Payer: Self-pay | Admitting: Obstetrics and Gynecology

## 2020-07-08 MED ORDER — IBUPROFEN 600 MG PO TABS: 600.0000 mg | ORAL_TABLET | Freq: Four times a day (QID) | ORAL | 3 refills | Status: DC | PRN

## 2020-07-08 NOTE — Telephone Encounter (Signed)
Pt states she has been rx'd pain med for menstrual before; she only uses it the first 2 days of her cycle; states it doesn't make sense to her that we can't give rx when we have done it before.  Would something hormonal to take every day help with the pain or what do you suggest she do?  Pt can be reached at this number later today.  [phone]

## 2020-07-08 NOTE — Telephone Encounter (Signed)
Left voice message to advise pt of AMS advice

## 2020-07-08 NOTE — Telephone Encounter (Signed)
Rx is in

## 2020-07-08 NOTE — Telephone Encounter (Signed)
I can not and will not write for a narcotic for her periods.  I can sent prescription strength NSAID but that is it.  I did not write her narcotic before Erskine Squibb did.

## 2020-07-08 NOTE — Telephone Encounter (Signed)
No I can't do chronic pain medicine we are not licensed for that

## 2020-07-09 NOTE — Telephone Encounter (Signed)
Pt states IUB doesn't help with her pain - has tried it before.

## 2020-07-09 NOTE — Telephone Encounter (Signed)
Pt is requesting a referral to someone that will help her with her pain.  Is frustrated with [place].

## 2020-07-13 ENCOUNTER — Telehealth (INDEPENDENT_AMBULATORY_CARE_PROVIDER_SITE_OTHER): Payer: Self-pay | Admitting: Gastroenterology

## 2020-07-13 ENCOUNTER — Other Ambulatory Visit: Payer: Self-pay

## 2020-07-13 DIAGNOSIS — Z1211 Encounter for screening for malignant neoplasm of colon: Secondary | ICD-10-CM

## 2020-07-13 MED ORDER — NA SULFATE-K SULFATE-MG SULF 17.5-3.13-1.6 GM/177ML PO SOLN: 1.0000 | Freq: Once | ORAL | 0 refills | Status: AC

## 2020-07-13 NOTE — Progress Notes (Signed)
Gastroenterology Pre-Procedure Review  Request Date: Friday 08/28/20 Requesting Physician: Dr. Maximino Greenland  PATIENT REVIEW QUESTIONS: The patient responded to the following health history questions as indicated:    1. Are you having any GI issues? no 2. Do you have a personal history of Polyps? no 3. Do you have a family history of Colon Cancer or Polyps? no 4. Diabetes Mellitus? no 5. Joint replacements in the past 12 months?no 6. Major health problems in the past 3 months?no 7. Any artificial heart valves, MVP, or defibrillator?no    MEDICATIONS & ALLERGIES:    Patient reports the following regarding taking any anticoagulation/antiplatelet therapy:   Plavix, Coumadin, Eliquis, Xarelto, Lovenox, Pradaxa, Brilinta, or Effient? no Aspirin? no  Patient confirms/reports the following medications:  Current Outpatient Medications  Medication Sig Dispense Refill  . Elagolix Sodium (ORILISSA) 150 MG TABS Take 1 tablet by mouth daily. 30 tablet 5  . ibuprofen (ADVIL) 600 MG tablet Take 1 tablet (600 mg total) by mouth every 6 (six) hours as needed. 60 tablet 3  . norethindrone (AYGESTIN) 5 MG tablet Take 1 tablet (5 mg total) by mouth daily. 30 tablet 11   No current facility-administered medications for this visit.    Patient confirms/reports the following allergies:  No Known Allergies  No orders of the defined types were placed in this encounter.   AUTHORIZATION INFORMATION Primary Insurance: 1D#: Group #:  Secondary Insurance: 1D#: Group #:  SCHEDULE INFORMATION: Date: 08/28/20 Time: Location:[facility]

## 2020-08-18 ENCOUNTER — Telehealth: Payer: Self-pay

## 2020-08-18 NOTE — Telephone Encounter (Signed)
Patient has requested to cancel her 08/28/20 Colonoscopy due to busy work schedule.  I advised that she can call the office back to reschedule when she is able to do so.  Thanks,  [place], [place]

## 2020-08-28 ENCOUNTER — Ambulatory Visit
Admission: RE | Admit: 2020-08-28 | Payer: PRIVATE HEALTH INSURANCE | Source: Home / Self Care | Admitting: Gastroenterology

## 2020-08-28 ENCOUNTER — Encounter: Admission: RE | Payer: Self-pay | Source: Home / Self Care

## 2020-08-28 SURGERY — COLONOSCOPY WITH PROPOFOL
Anesthesia: General

## 2020-08-31 ENCOUNTER — Other Ambulatory Visit: Payer: Self-pay

## 2020-08-31 ENCOUNTER — Ambulatory Visit
Admission: RE | Admit: 2020-08-31 | Discharge: 2020-08-31 | Disposition: A | Discharge status: Home / Self Care | Payer: PRIVATE HEALTH INSURANCE | Source: Ambulatory Visit | Attending: Obstetrics and Gynecology | Admitting: Obstetrics and Gynecology

## 2020-08-31 DIAGNOSIS — Z1231 Encounter for screening mammogram for malignant neoplasm of breast: Secondary | ICD-10-CM | POA: Diagnosis present

## 2020-08-31 DIAGNOSIS — Z1239 Encounter for other screening for malignant neoplasm of breast: Secondary | ICD-10-CM

## 2020-10-06 ENCOUNTER — Ambulatory Visit: Payer: PRIVATE HEALTH INSURANCE | Admitting: Obstetrics and Gynecology

## 2020-10-29 ENCOUNTER — Telehealth: Payer: Self-pay

## 2020-10-29 NOTE — Telephone Encounter (Signed)
It looks like she has switched to [employer]

## 2020-10-29 NOTE — Telephone Encounter (Signed)
Pt calling for rx for generic of valtrex.  [phone]  Pt states it was rx'd during her preg or 2015.  Is not having sxs; just needs rx.  Pharm correct in chart.

## 2020-10-29 NOTE — Telephone Encounter (Signed)
Called pt; states she has not switched to [facility].

## 2021-01-11 ENCOUNTER — Telehealth: Payer: Self-pay

## 2021-01-11 NOTE — Telephone Encounter (Signed)
Pt calling for pap results; they are not in MyChart.  [phone]  Adv pt a pap wasn't done at her last appt; opt for every 4yrs so next pap would be due next year.

## 2021-02-01 ENCOUNTER — Ambulatory Visit (INDEPENDENT_AMBULATORY_CARE_PROVIDER_SITE_OTHER): Payer: Managed Care, Other (non HMO) | Admitting: Internal Medicine

## 2021-02-01 ENCOUNTER — Other Ambulatory Visit: Payer: Self-pay

## 2021-02-01 ENCOUNTER — Ambulatory Visit
Admission: RE | Admit: 2021-02-01 | Discharge: 2021-02-01 | Disposition: A | Discharge status: Home / Self Care | Payer: Managed Care, Other (non HMO) | Source: Ambulatory Visit | Attending: Internal Medicine | Admitting: Internal Medicine

## 2021-02-01 ENCOUNTER — Encounter: Payer: Self-pay | Admitting: Internal Medicine

## 2021-02-01 ENCOUNTER — Ambulatory Visit
Admission: RE | Admit: 2021-02-01 | Discharge: 2021-02-01 | Disposition: A | Discharge status: Home / Self Care | Payer: Managed Care, Other (non HMO) | Attending: Internal Medicine | Admitting: Internal Medicine

## 2021-02-01 VITALS — BP 125/73 | HR 85 | Temp 98.0°F | Resp 17 | Ht 65.0 in | Wt 143.4 lb

## 2021-02-01 DIAGNOSIS — Z114 Encounter for screening for human immunodeficiency virus [HIV]: Secondary | ICD-10-CM | POA: Diagnosis not present

## 2021-02-01 DIAGNOSIS — Z1159 Encounter for screening for other viral diseases: Secondary | ICD-10-CM

## 2021-02-01 DIAGNOSIS — Z131 Encounter for screening for diabetes mellitus: Secondary | ICD-10-CM

## 2021-02-01 DIAGNOSIS — M25562 Pain in left knee: Secondary | ICD-10-CM | POA: Diagnosis not present

## 2021-02-01 DIAGNOSIS — Z13220 Encounter for screening for lipoid disorders: Secondary | ICD-10-CM | POA: Diagnosis not present

## 2021-02-01 NOTE — Progress Notes (Signed)
HPI  Patient presents to clinic today to establish care.  She has not had a PCP in many years.  She does complain of intermittent left knee pain.  She reports this started a few months ago.  She describes the pain as swelling.  The pain is worse after standing up at work for long periods of time.  She denies any injury to the area.  She has not taken anything OTC for this.  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     No current facility-administered medications for this visit.    No Known Allergies  Family History  Problem Relation Age of Onset   Breast cancer Mother 31   Cancer Father 32       colon    Social History   Socioeconomic History   Marital status: Single    Spouse name: Not on file   Number of children: 2   Years of education: Not on file   Highest education level: Not on file  Occupational History   Not on file  Tobacco Use   Smoking status: Never   Smokeless tobacco: Never  Vaping Use   Vaping Use: Never used  Substance and Sexual Activity   Alcohol use: Never   Drug use: Never   Sexual activity: Not Currently    Birth control/protection: None  Other Topics Concern   Not on file  Social History Narrative   Not on file   Social Determinants of Health   Financial Resource Strain: Not on file  Food Insecurity: Not on file  Transportation Needs: Not on file  Physical Activity: Not on file  Stress: Not on file  Social Connections: Not on file  Intimate Partner Violence: Not on file    ROS:  Constitutional: Denies fever, malaise, fatigue, headache or abrupt weight changes.  HEENT: Denies eye pain, eye redness, ear pain, ringing in the ears, wax buildup, runny  nose, nasal congestion, bloody nose, or sore throat. Respiratory: Denies difficulty breathing, shortness of breath, cough or sputum production.   Cardiovascular: Denies chest pain, chest tightness, palpitations or swelling in the hands or feet.  Gastrointestinal: Denies abdominal pain, bloating, constipation, diarrhea or blood in the stool.  GU: Denies frequency, urgency, pain with urination, blood in urine, odor or discharge. Musculoskeletal: Patient reports intermittent left knee pain.  Denies decrease in range of motion, difficulty with gait, muscle pain or joint swelling.  Skin: Denies redness, rashes, lesions or ulcercations.  Neurological: Denies dizziness, difficulty with memory, difficulty with speech or problems with balance and coordination.  Psych: Denies anxiety, depression, SI/HI.  No other specific complaints in a complete review of systems (except as listed in HPI above).  PE:  BP 125/73 (BP Location: Right Arm, Patient Position: Sitting, Cuff Size: Normal)   Pulse 85   Temp 98 F (36.7 C) (Temporal)   Resp 17   Ht _0  (1.651 m)   Wt 143 lb 6.4 oz (65 kg)   LMP 01/27/2021   SpO2 99%   BMI 23.86 kg/m  Wt Readings from Last 3 Encounters:  02/01/21 143 lb 6.4 oz (65 kg)  07/06/20 143 lb (64.9 kg)  09/09/19 148 lb (67.1 kg)  General: Appears her stated age, well developed, well nourished in NAD. HEENT: Head: normal shape and size; Eyes: sclera white and EOMs intact; Neck: Neck supple, trachea midline. No masses, lumps or thyromegaly present.  Cardiovascular: Normal rate and rhythm. S1,S2 noted.  No murmur, rubs or gallops noted.  Pulmonary/Chest: Normal effort and positive vesicular breath sounds. No respiratory distress. No wheezes, rales or ronchi noted.  Musculoskeletal: Normal flexion extension of the left knee.  No pain with palpation of the left knee.  Strength 5/5 BUE/BLE. No signs of joint swelling. No difficulty with gait.  Neurological: Alert and oriented.   Psychiatric: Mood and affect normal. Behavior is normal. Judgment and thought content normal.     BMET    Component Value Date/Time   NA 139 01/02/2014 0210   K 3.1 (L) 01/02/2014 0210   CL 103 01/02/2014 0210   CO2 24 01/02/2014 0210   GLUCOSE 96 01/02/2014 0210   BUN 4 (L) 01/02/2014 0210   CREATININE 0.78 01/02/2014 0210   CALCIUM 9.7 01/02/2014 0210   GFRNONAA >60 01/02/2014 0210   GFRAA >60 01/02/2014 0210    Lipid Panel  No results found for: CHOL, TRIG, HDL, CHOLHDL, VLDL, LDLCALC  CBC    Component Value Date/Time   WBC 7.5 01/02/2014 0210   RBC 4.34 01/02/2014 0210   HGB 13.3 01/02/2014 0210   HCT 39.1 01/02/2014 0210   PLT 400 01/02/2014 0210   MCV 90 01/02/2014 0210   MCH 30.7 01/02/2014 0210   MCHC 34.0 01/02/2014 0210   RDW 13.8 01/02/2014 0210   LYMPHSABS 1.7 01/02/2014 0210   MONOABS 0.3 01/02/2014 0210   EOSABS 0.1 01/02/2014 0210   BASOSABS 0.1 01/02/2014 0210    Hgb A1C No results found for: HGBA1C   Assessment and Plan:  Left Knee Pain:  X-ray left knee today  Screen for HLD:  C-Met, lipid and A1c today  Screening for DM2:  A1c today  Screening for HIV:  HIV antibody today  Screen for Hep C:  Hep C antibody today  RTC in 1 year, sooner if needed Webb Silversmith, NP This visit occurred during the SARS-CoV-2 public health emergency.  Safety protocols were in place, including screening questions prior to the visit, additional usage of staff PPE, and extensive cleaning of exam room while observing appropriate contact time as indicated for disinfecting solutions.

## 2021-02-01 NOTE — Patient Instructions (Signed)
Journal for Nurse Practitioners, 15(4), 263-267. Retrieved May 28, 2018 from http://clinicalkey.com/nursing">  Knee Exercises Ask your health care provider which exercises are safe for you. Do exercises exactly as told by your health care provider and adjust them as directed. It is normal to feel mild stretching, pulling, tightness, or discomfort as you do these exercises. Stop right away if you feel sudden pain or your pain gets worse. Do not begin these exercises until told by your health care provider. Stretching and range-of-motion exercises These exercises warm up your muscles and joints and improve the movement and flexibility of your knee. These exercises also help to relieve pain andswelling. Knee extension, prone Lie on your abdomen (prone position) on a bed. Place your left / right knee just beyond the edge of the surface so your knee is not on the bed. You can put a towel under your left / right thigh just above your kneecap for comfort. Relax your leg muscles and allow gravity to straighten your knee (extension). You should feel a stretch behind your left / right knee. Hold this position for __________ seconds. Scoot up so your knee is supported between repetitions. Repeat __________ times. Complete this exercise __________ times a day. Knee flexion, active  Lie on your back with both legs straight. If this causes back discomfort, bend your left / right knee so your foot is flat on the floor. Slowly slide your left / right heel back toward your buttocks. Stop when you feel a gentle stretch in the front of your knee or thigh (flexion). Hold this position for __________ seconds. Slowly slide your left / right heel back to the starting position. Repeat __________ times. Complete this exercise __________ times a day. Quadriceps stretch, prone  Lie on your abdomen on a firm surface, such as a bed or padded floor. Bend your left / right knee and hold your ankle. If you cannot reach  your ankle or pant leg, loop a belt around your foot and grab the belt instead. Gently pull your heel toward your buttocks. Your knee should not slide out to the side. You should feel a stretch in the front of your thigh and knee (quadriceps). Hold this position for __________ seconds. Repeat __________ times. Complete this exercise __________ times a day. Hamstring, supine Lie on your back (supine position). Loop a belt or towel over the ball of your left / right foot. The ball of your foot is on the walking surface, right under your toes. Straighten your left / right knee and slowly pull on the belt to raise your leg until you feel a gentle stretch behind your knee (hamstring). Do not let your knee bend while you do this. Keep your other leg flat on the floor. Hold this position for __________ seconds. Repeat __________ times. Complete this exercise __________ times a day. Strengthening exercises These exercises build strength and endurance in your knee. Endurance is theability to use your muscles for a long time, even after they get tired. Quadriceps, isometric This exercise stretches the muscles in front of your thigh (quadriceps) without moving your knee joint (isometric). Lie on your back with your left / right leg extended and your other knee bent. Put a rolled towel or small pillow under your knee if told by your health care provider. Slowly tense the muscles in the front of your left / right thigh. You should see your kneecap slide up toward your hip or see increased dimpling just above the knee. This motion will   push the back of the knee toward the floor. For __________ seconds, hold the muscle as tight as you can without increasing your pain. Relax the muscles slowly and completely. Repeat __________ times. Complete this exercise __________ times a day. Straight leg raises This exercise stretches the muscles in front of your thigh (quadriceps) and the muscles that move your hips (hip  flexors). Lie on your back with your left / right leg extended and your other knee bent. Tense the muscles in the front of your left / right thigh. You should see your kneecap slide up or see increased dimpling just above the knee. Your thigh may even shake a bit. Keep these muscles tight as you raise your leg 4-6 inches (10-15 cm) off the floor. Do not let your knee bend. Hold this position for __________ seconds. Keep these muscles tense as you lower your leg. Relax your muscles slowly and completely after each repetition. Repeat __________ times. Complete this exercise __________ times a day. Hamstring, isometric Lie on your back on a firm surface. Bend your left / right knee about __________ degrees. Dig your left / right heel into the surface as if you are trying to pull it toward your buttocks. Tighten the muscles in the back of your thighs (hamstring) to "dig" as hard as you can without increasing any pain. Hold this position for __________ seconds. Release the tension gradually and allow your muscles to relax completely for __________ seconds after each repetition. Repeat __________ times. Complete this exercise __________ times a day. Hamstring curls If told by your health care provider, do this exercise while wearing ankle weights. Begin with __________ lb weights. Then increase the weight by 1 lb (0.5 kg) increments. Do not wear ankle weights that are more than __________ lb. Lie on your abdomen with your legs straight. Bend your left / right knee as far as you can without feeling pain. Keep your hips flat against the floor. Hold this position for __________ seconds. Slowly lower your leg to the starting position. Repeat __________ times. Complete this exercise __________ times a day. Squats This exercise strengthens the muscles in front of your thigh and knee (quadriceps). Stand in front of a table, with your feet and knees pointing straight ahead. You may rest your hands on the  table for balance but not for support. Slowly bend your knees and lower your hips like you are going to sit in a chair. Keep your weight over your heels, not over your toes. Keep your lower legs upright so they are parallel with the table legs. Do not let your hips go lower than your knees. Do not bend lower than told by your health care provider. If your knee pain increases, do not bend as low. Hold the squat position for __________ seconds. Slowly push with your legs to return to standing. Do not use your hands to pull yourself to standing. Repeat __________ times. Complete this exercise __________ times a day. Wall slides This exercise strengthens the muscles in front of your thigh and knee (quadriceps). Lean your back against a smooth wall or door, and walk your feet out 18-24 inches (46-61 cm) from it. Place your feet hip-width apart. Slowly slide down the wall or door until your knees bend __________ degrees. Keep your knees over your heels, not over your toes. Keep your knees in line with your hips. Hold this position for __________ seconds. Repeat __________ times. Complete this exercise __________ times a day. Straight leg raises This exercise   strengthens the muscles that rotate the leg at the hip and move it away from your body (hip abductors). Lie on your side with your left / right leg in the top position. Lie so your head, shoulder, knee, and hip line up. You may bend your bottom knee to help you keep your balance. Roll your hips slightly forward so your hips are stacked directly over each other and your left / right knee is facing forward. Leading with your heel, lift your top leg 4-6 inches (10-15 cm). You should feel the muscles in your outer hip lifting. Do not let your foot drift forward. Do not let your knee roll toward the ceiling. Hold this position for __________ seconds. Slowly return your leg to the starting position. Let your muscles relax completely after each  repetition. Repeat __________ times. Complete this exercise __________ times a day. Straight leg raises This exercise stretches the muscles that move your hips away from the front of the pelvis (hip extensors). Lie on your abdomen on a firm surface. You can put a pillow under your hips if that is more comfortable. Tense the muscles in your buttocks and lift your left / right leg about 4-6 inches (10-15 cm). Keep your knee straight as you lift your leg. Hold this position for __________ seconds. Slowly lower your leg to the starting position. Let your leg relax completely after each repetition. Repeat __________ times. Complete this exercise __________ times a day. This information is not intended to replace advice given to you by your health care provider. Make sure you discuss any questions you have with your healthcare provider. Document Revised: 05/29/2018 Document Reviewed: 05/29/2018 Elsevier Patient Education  2022 [employer]  

## 2021-02-01 NOTE — Addendum Note (Signed)
Addended by: Lonna Cobb on: 02/01/2021 11:59 AM   Modules accepted: Orders

## 2021-02-05 LAB — LIPID PANEL
Chol/HDL Ratio: 3 ratio (ref 0.0–4.4)
Cholesterol, Total: 180 mg/dL (ref 100–199)
HDL: 61 mg/dL (ref >39)
LDL Chol Calc (NIH): 103 mg/dL — ABNORMAL HIGH (ref 0–99)
Triglycerides: 86 mg/dL (ref 0–149)
VLDL Cholesterol Cal: 16 mg/dL (ref 5–40)

## 2021-02-05 LAB — COMPREHENSIVE METABOLIC PANEL
ALT: 25 IU/L (ref 0–32)
AST: 24 IU/L (ref 0–40)
Albumin/Globulin Ratio: 2 (ref 1.2–2.2)
Albumin: 5.1 g/dL — ABNORMAL HIGH (ref 3.8–4.8)
Alkaline Phosphatase: 53 IU/L (ref 44–121)
BUN/Creatinine Ratio: 14 (ref 9–23)
BUN: 10 mg/dL (ref 6–24)
Bilirubin Total: 0.2 mg/dL (ref 0.0–1.2)
CO2: 22 mmol/L (ref 20–29)
Calcium: 10.1 mg/dL (ref 8.7–10.2)
Chloride: 100 mmol/L (ref 96–106)
Creatinine, Ser: 0.69 mg/dL (ref 0.57–1.00)
Globulin, Total: 2.6 g/dL (ref 1.5–4.5)
Glucose: 80 mg/dL (ref 65–99)
Potassium: 4.1 mmol/L (ref 3.5–5.2)
Sodium: 140 mmol/L (ref 134–144)
Total Protein: 7.7 g/dL (ref 6.0–8.5)
eGFR: 112 mL/min/{1.73_m2} (ref >59)

## 2021-02-05 LAB — CBC
Hematocrit: 34.9 % (ref 34.0–46.6)
Hemoglobin: 11.7 g/dL (ref 11.1–15.9)
MCH: 31.9 pg (ref 26.6–33.0)
MCHC: 33.5 g/dL (ref 31.5–35.7)
MCV: 95 fL (ref 79–97)
Platelets: 355 10*3/uL (ref 150–450)
RBC: 3.67 x10E6/uL — ABNORMAL LOW (ref 3.77–5.28)
RDW: 12 % (ref 11.7–15.4)
WBC: 5.8 10*3/uL (ref 3.4–10.8)

## 2021-02-05 LAB — HIV ANTIBODY (ROUTINE TESTING W REFLEX): HIV Screen 4th Generation wRfx: NONREACTIVE

## 2021-02-05 LAB — TSH: TSH: 2.21 u[IU]/mL (ref 0.450–4.500)

## 2021-02-05 LAB — HEPATITIS C ANTIBODY: Hep C Virus Ab: 0.1 s/co ratio (ref 0.0–0.9)

## 2021-02-05 LAB — HEMOGLOBIN A1C
Est. average glucose Bld gHb Est-mCnc: 94 mg/dL
Hgb A1c MFr Bld: 4.9 % (ref 4.8–5.6)

## 2021-06-21 ENCOUNTER — Other Ambulatory Visit: Payer: Self-pay | Admitting: Obstetrics and Gynecology

## 2022-03-24 IMAGING — DX DG KNEE COMPLETE 4+V*L*
4 series · 4 of 4 positions shown · non-contrast
Comparison: None.

CLINICAL DATA: Pain

EXAM:
LEFT KNEE - COMPLETE 4+ VIEW

[knee ap]
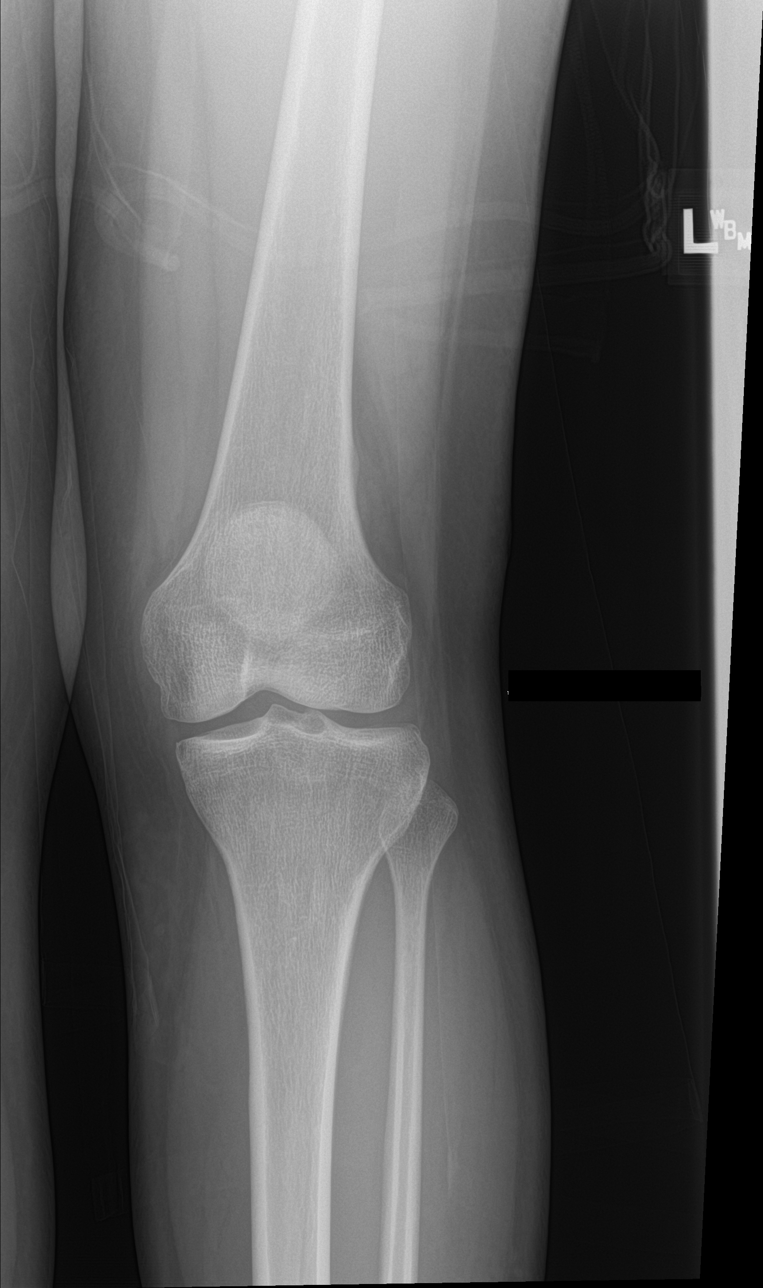

[knee tunnel]
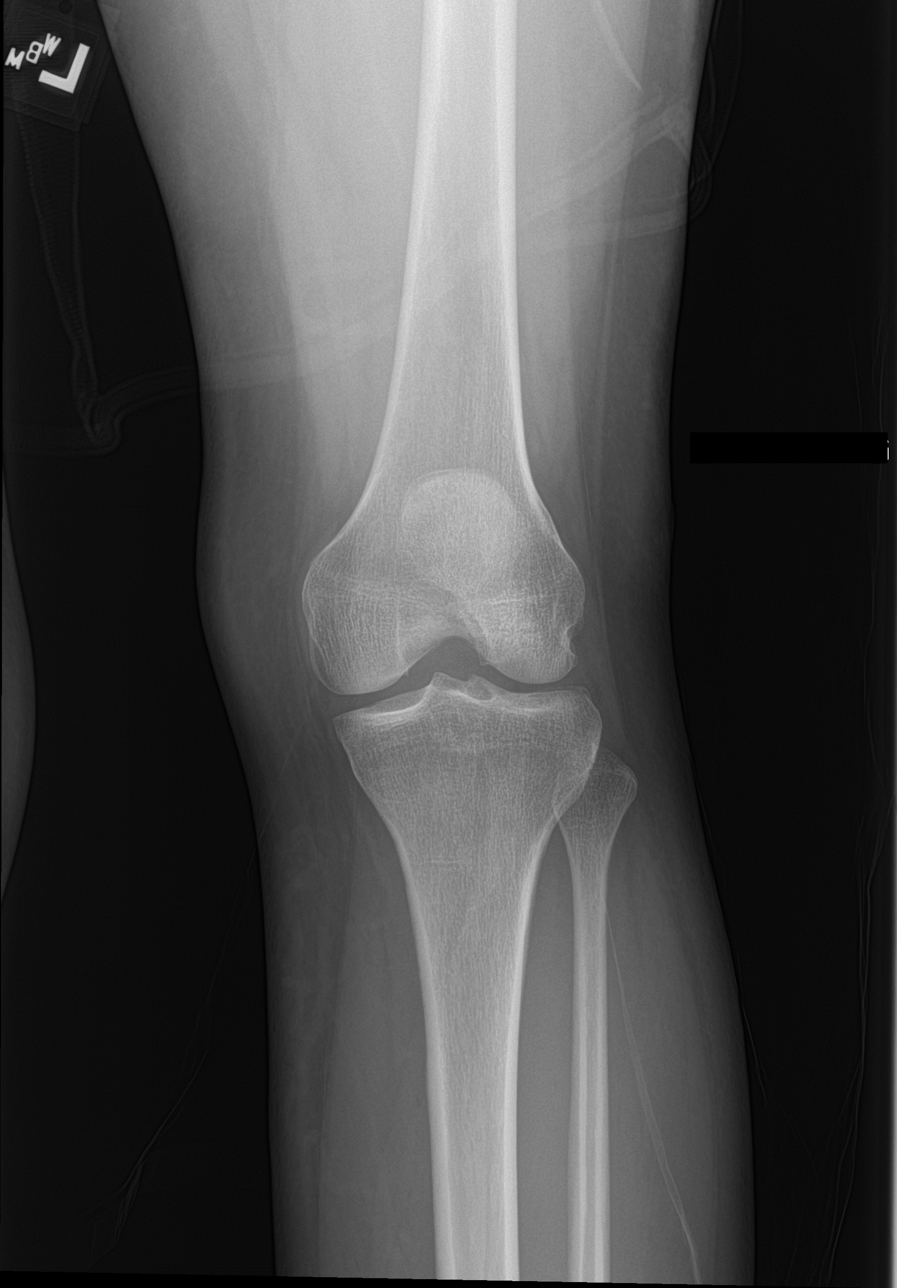

[knee lat]
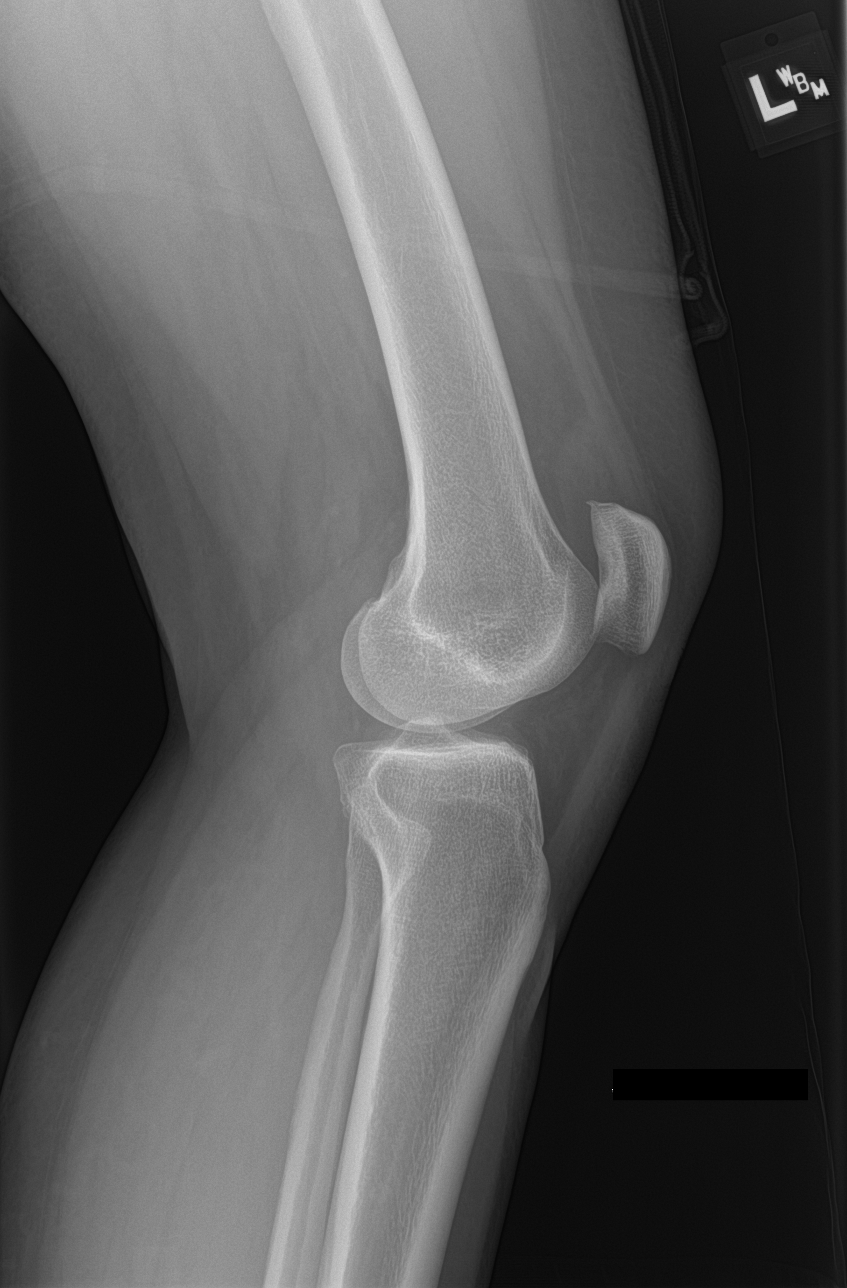

[patella skyline]
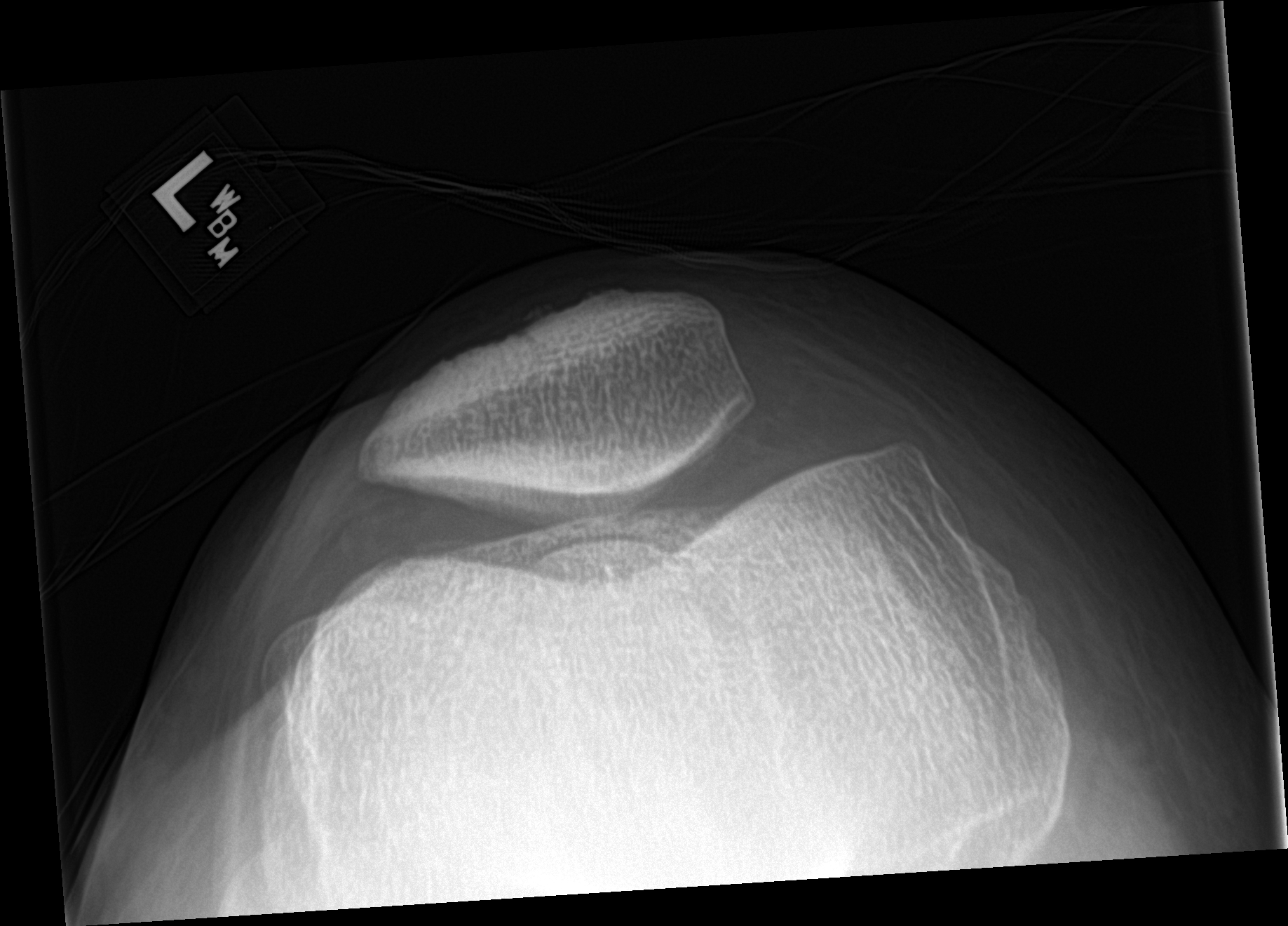

[4 of 4 positions shown; findings below may reference images not displayed]

FINDINGS: No acute fracture or dislocation. Joint spaces and alignment are
maintained. No area of erosion or osseous destruction. No unexpected
radiopaque foreign body. Soft tissues are unremarkable.
IMPRESSION: No acute fracture or dislocation.

## 2022-10-23 ENCOUNTER — Encounter: Payer: Self-pay | Admitting: Emergency Medicine

## 2022-10-23 ENCOUNTER — Emergency Department
Admission: EM | Admit: 2022-10-23 | Discharge: 2022-10-23 | Disposition: A | Discharge status: Home / Self Care | Payer: PRIVATE HEALTH INSURANCE | Attending: Emergency Medicine | Admitting: Emergency Medicine

## 2022-10-23 ENCOUNTER — Other Ambulatory Visit: Payer: Self-pay

## 2022-10-23 DIAGNOSIS — T40725A Adverse effect of synthetic cannabinoids, initial encounter: Secondary | ICD-10-CM | POA: Diagnosis not present

## 2022-10-23 DIAGNOSIS — R112 Nausea with vomiting, unspecified: Secondary | ICD-10-CM | POA: Diagnosis not present

## 2022-10-23 MED ORDER — ONDANSETRON HCL 4 MG/2ML IJ SOLN
4.0000 mg | Freq: Once | INTRAMUSCULAR | Status: AC
  Administered 2022-10-23: 4 mg via INTRAVENOUS
  Filled 2022-10-23: qty 2

## 2022-10-23 MED ORDER — SODIUM CHLORIDE 0.9 % IV BOLUS
1000.0000 mL | Freq: Once | INTRAVENOUS | Status: AC
Start: 2022-10-23 — End: 2022-10-23
  Administered 2022-10-23: 1000 mL via INTRAVENOUS

## 2022-10-23 MED ORDER — ONDANSETRON 4 MG PO TBDP: 4.0000 mg | ORAL_TABLET | Freq: Three times a day (TID) | ORAL | 0 refills | Status: DC | PRN

## 2022-10-23 MED ORDER — DIAZEPAM 5 MG/ML IJ SOLN
5.0000 mg | Freq: Once | INTRAMUSCULAR | Status: AC
  Administered 2022-10-23: 5 mg via INTRAVENOUS
  Filled 2022-10-23: qty 2

## 2022-10-23 NOTE — ED Provider Notes (Signed)
[facility] Provider Note    Event Date/Time   First MD Initiated Contact with Patient 10/23/22 1721     (approximate)   History   Chief Complaint: Ingestion   HPI  Sheena [name] is a 42 y.o. female with no significant past medical history who comes to the ED due to nausea and vomiting after eating a rice crispy treat containing cannabinoid product.  No chest pain shortness of breath fever abdominal pain.  No falls trauma or syncope.  Never tried edible cannabinoids before.     Physical Exam   Triage Vital Signs: ED Triage Vitals  Enc Vitals Group     BP 10/23/22 1708 101/66     Pulse Rate 10/23/22 1708 (!) 104     Resp 10/23/22 1708 20     Temp 10/23/22 1708 97.8 F (36.6 C)     Temp Source 10/23/22 1708 Oral     SpO2 10/23/22 1708 100 %     Weight 10/23/22 1706 140 lb (63.5 kg)     Height 10/23/22 1706 '5\' 5"'$  (1.651 m)     Head Circumference --      Peak Flow --      Pain Score 10/23/22 1706 0     Pain Loc --      Pain Edu? --      Excl. in [place]? --     Most recent vital signs: Vitals:   10/23/22 1708  BP: 101/66  Pulse: (!) 104  Resp: 20  Temp: 97.8 F (36.6 C)  SpO2: 100%    General: Awake, no distress.  CV:  Good peripheral perfusion.  Regular rate and rhythm Resp:  Normal effort.  Clear to auscultation bilaterally Abd:  No distention.  Soft nontender Other:  Moist oral mucosa   ED Results / Procedures / Treatments   Labs (all labs ordered are listed, but only abnormal results are displayed) Labs Reviewed - No data to display   EKG Interpreted by me Sinus rhythm rate of 81.  Normal axis intervals QRS ST segments and T waves   RADIOLOGY    PROCEDURES:  Procedures   MEDICATIONS ORDERED IN ED: Medications  sodium chloride 0.9 % bolus 1,000 mL (1,000 mLs Intravenous New Bag/Given 10/23/22 1742)  diazepam (VALIUM) injection 5 mg (5 mg Intravenous Given 10/23/22 1742)  ondansetron (ZOFRAN) injection 4 mg (4 mg  Intravenous Given 10/23/22 1741)     IMPRESSION / MDM / ASSESSMENT AND PLAN / ED COURSE  I reviewed the triage vital signs and the nursing notes.    Patient's presentation is most consistent with acute illness / injury with system symptoms.  Patient presents with nausea vomiting and malaise after ingesting edible cannabinoid product.  Vital signs are unremarkable, exam is benign.  EKG is unremarkable.  Patient given IV fluids, Zofran, Valium and feeling much better and tolerating oral intake.  Stable for discharge home, family member is here to accompany her.       FINAL CLINICAL IMPRESSION(S) / ED DIAGNOSES   Final diagnoses:  Adverse effect of synthetic cannabinoid, initial encounter     Rx / DC Orders   ED Discharge Orders          Ordered    ondansetron (ZOFRAN-ODT) 4 MG disintegrating tablet  Every 8 hours PRN        10/23/22 2043             Note:  This document was prepared using Dragon voice recognition  software and may include unintentional dictation errors.   Carrie Mew, MD 10/23/22 2045

## 2022-10-23 NOTE — ED Triage Notes (Signed)
Pt via EMS from home. Pt took a rice krispy treat edible. She ate the entire thing, got it from a store. Pt c/o leg tightness. States she has never taken an edible like this, suppose to be just marijuana. Denies any other substance abuse or ETOH use. Pt is shaky but denies any chest pain/sob. Pt is A&Ox4 and NAD HR 120s  995 on RA 112/86 BP

## 2022-10-24 ENCOUNTER — Telehealth: Payer: Self-pay

## 2022-10-24 NOTE — Transitions of Care (Post Inpatient/ED Visit) (Unsigned)
   10/24/2022  Name: Sheena [name] MRN: [id number] DOB: 15-Aug-1981  Today's TOC FU Call Status: Today's TOC FU Call Status:: Unsuccessul Call (1st Attempt) Unsuccessful Call (1st Attempt) Date: 10/24/22  Attempted to reach the patient regarding the most recent Inpatient/ED visit.  Follow Up Plan: Additional outreach attempts will be made to reach the patient to complete the Transitions of Care (Post Inpatient/ED visit) call.   Dione Booze, CMA(AAMA)

## 2022-10-25 NOTE — Transitions of Care (Post Inpatient/ED Visit) (Unsigned)
   10/25/2022  Name: Sheena [name] MRN: [id number] DOB: 1981-06-17  Today's TOC FU Call Status: Today's TOC FU Call Status:: Unsuccessful Call (2nd Attempt) Unsuccessful Call (1st Attempt) Date: 10/24/22 Unsuccessful Call (2nd Attempt) Date: 10/25/22  Attempted to reach the patient regarding the most recent Inpatient/ED visit.  Follow Up Plan: Additional outreach attempts will be made to reach the patient to complete the Transitions of Care (Post Inpatient/ED visit) call.   Dione Booze [facility])

## 2022-10-26 NOTE — Transitions of Care (Post Inpatient/ED Visit) (Signed)
   10/26/2022  Name: Sheena [name] MRN: [id number] DOB: 01/23/1981  Today's TOC FU Call Status: Today's TOC FU Call Status:: Unsuccessful Call (3rd Attempt) Unsuccessful Call (1st Attempt) Date: 10/24/22 Unsuccessful Call (2nd Attempt) Date: 10/25/22 Unsuccessful Call (3rd Attempt) Date: 10/26/22  Attempted to reach the patient regarding the most recent Inpatient/ED visit.  Follow Up Plan: No further outreach attempts will be made at this time. We have been unable to contact the patient.  Dione Booze, [facility]) [facility] [phone]

## 2023-07-11 ENCOUNTER — Other Ambulatory Visit: Payer: Self-pay | Admitting: Obstetrics and Gynecology

## 2023-07-11 ENCOUNTER — Encounter: Payer: Self-pay | Admitting: Obstetrics and Gynecology

## 2023-07-11 DIAGNOSIS — Z1231 Encounter for screening mammogram for malignant neoplasm of breast: Secondary | ICD-10-CM

## 2023-07-27 ENCOUNTER — Ambulatory Visit
Admission: RE | Admit: 2023-07-27 | Discharge: 2023-07-27 | Disposition: A | Discharge status: Home / Self Care | Payer: PRIVATE HEALTH INSURANCE | Source: Ambulatory Visit | Attending: Obstetrics and Gynecology | Admitting: Obstetrics and Gynecology

## 2023-07-27 DIAGNOSIS — Z1231 Encounter for screening mammogram for malignant neoplasm of breast: Secondary | ICD-10-CM | POA: Diagnosis present

## 2023-08-02 ENCOUNTER — Other Ambulatory Visit: Payer: Self-pay | Admitting: Obstetrics and Gynecology

## 2023-08-02 DIAGNOSIS — R921 Mammographic calcification found on diagnostic imaging of breast: Secondary | ICD-10-CM

## 2023-08-02 DIAGNOSIS — R928 Other abnormal and inconclusive findings on diagnostic imaging of breast: Secondary | ICD-10-CM

## 2023-08-23 DIAGNOSIS — D0512 Intraductal carcinoma in situ of left breast: Secondary | ICD-10-CM

## 2023-08-23 HISTORY — DX: Intraductal carcinoma in situ of left breast: D05.12

## 2023-08-25 ENCOUNTER — Ambulatory Visit
Admission: RE | Admit: 2023-08-25 | Discharge: 2023-08-25 | Disposition: A | Discharge status: Home / Self Care | Payer: BC Managed Care – PPO | Source: Ambulatory Visit | Attending: Obstetrics and Gynecology | Admitting: Obstetrics and Gynecology

## 2023-08-25 DIAGNOSIS — R921 Mammographic calcification found on diagnostic imaging of breast: Secondary | ICD-10-CM | POA: Diagnosis present

## 2023-08-25 DIAGNOSIS — R928 Other abnormal and inconclusive findings on diagnostic imaging of breast: Secondary | ICD-10-CM | POA: Diagnosis present

## 2023-08-29 ENCOUNTER — Other Ambulatory Visit: Payer: Self-pay | Admitting: Obstetrics and Gynecology

## 2023-08-29 DIAGNOSIS — R928 Other abnormal and inconclusive findings on diagnostic imaging of breast: Secondary | ICD-10-CM

## 2023-08-29 DIAGNOSIS — R921 Mammographic calcification found on diagnostic imaging of breast: Secondary | ICD-10-CM

## 2023-08-31 ENCOUNTER — Ambulatory Visit
Admission: RE | Admit: 2023-08-31 | Discharge: 2023-08-31 | Disposition: A | Discharge status: Home / Self Care | Payer: BC Managed Care – PPO | Source: Ambulatory Visit | Attending: Obstetrics and Gynecology | Admitting: Obstetrics and Gynecology

## 2023-08-31 DIAGNOSIS — R928 Other abnormal and inconclusive findings on diagnostic imaging of breast: Secondary | ICD-10-CM

## 2023-08-31 DIAGNOSIS — D0512 Intraductal carcinoma in situ of left breast: Secondary | ICD-10-CM | POA: Insufficient documentation

## 2023-08-31 DIAGNOSIS — R921 Mammographic calcification found on diagnostic imaging of breast: Secondary | ICD-10-CM

## 2023-08-31 HISTORY — PX: BREAST BIOPSY: SHX20

## 2023-08-31 MED ORDER — LIDOCAINE HCL (PF) 2 % IJ SOLN
10.0000 mL | Freq: Once | INTRAMUSCULAR | Status: AC
  Administered 2023-08-31: 10 mL
  Filled 2023-08-31: qty 10

## 2023-08-31 MED ORDER — LIDOCAINE HCL 1 % IJ SOLN
15.0000 mL | Freq: Once | INTRAMUSCULAR | Status: AC
  Administered 2023-08-31: 15 mL
  Filled 2023-08-31: qty 15

## 2023-09-01 LAB — SURGICAL PATHOLOGY

## 2023-09-04 ENCOUNTER — Encounter: Payer: Self-pay | Admitting: *Deleted

## 2023-09-04 DIAGNOSIS — D0512 Intraductal carcinoma in situ of left breast: Secondary | ICD-10-CM

## 2023-09-04 NOTE — Progress Notes (Signed)
 Received referral for newly diagnosed breast cancer from [facility].  Navigation initiated.  Voice mail left asking for return call to discuss surgeons and medical oncologists.

## 2023-09-04 NOTE — Progress Notes (Signed)
 Ms. Tegethoff returned my call.  She is going to see Dr. Tonna Boehringer tomorrow at 10:30 and Dr. Cathie Hoops on Wednesday at 11:15.

## 2023-09-06 ENCOUNTER — Inpatient Hospital Stay: Payer: BC Managed Care – PPO

## 2023-09-06 ENCOUNTER — Inpatient Hospital Stay: Payer: BC Managed Care – PPO | Attending: Oncology | Admitting: Oncology

## 2023-09-06 ENCOUNTER — Encounter: Payer: Self-pay | Admitting: Genetic Counselor

## 2023-09-06 ENCOUNTER — Encounter: Payer: Self-pay | Admitting: *Deleted

## 2023-09-06 ENCOUNTER — Encounter: Payer: Self-pay | Admitting: Oncology

## 2023-09-06 VITALS — BP 120/76 | HR 78 | Temp 97.9°F | Resp 18 | Wt 134.6 lb

## 2023-09-06 DIAGNOSIS — D0512 Intraductal carcinoma in situ of left breast: Secondary | ICD-10-CM

## 2023-09-06 DIAGNOSIS — Z803 Family history of malignant neoplasm of breast: Secondary | ICD-10-CM | POA: Diagnosis not present

## 2023-09-06 DIAGNOSIS — Z8042 Family history of malignant neoplasm of prostate: Secondary | ICD-10-CM | POA: Diagnosis not present

## 2023-09-06 LAB — CBC WITH DIFFERENTIAL/PLATELET
Abs Immature Granulocytes: 0.02 10*3/uL (ref 0.00–0.07)
Basophils Absolute: 0.1 10*3/uL (ref 0.0–0.1)
Basophils Relative: 1 %
Eosinophils Absolute: 0.2 10*3/uL (ref 0.0–0.5)
Eosinophils Relative: 3 %
HCT: 38 % (ref 36.0–46.0)
Hemoglobin: 12.8 g/dL (ref 12.0–15.0)
Immature Granulocytes: 0 %
Lymphocytes Relative: 27 %
Lymphs Abs: 1.6 10*3/uL (ref 0.7–4.0)
MCH: 32.4 pg (ref 26.0–34.0)
MCHC: 33.7 g/dL (ref 30.0–36.0)
MCV: 96.2 fL (ref 80.0–100.0)
Monocytes Absolute: 0.3 10*3/uL (ref 0.1–1.0)
Monocytes Relative: 5 %
Neutro Abs: 3.6 10*3/uL (ref 1.7–7.7)
Neutrophils Relative %: 64 %
Platelets: 407 10*3/uL — ABNORMAL HIGH (ref 150–400)
RBC: 3.95 MIL/uL (ref 3.87–5.11)
RDW: 11.7 % (ref 11.5–15.5)
WBC: 5.7 10*3/uL (ref 4.0–10.5)
nRBC: 0 % (ref 0.0–0.2)

## 2023-09-06 LAB — COMPREHENSIVE METABOLIC PANEL
ALT: 27 U/L (ref 0–44)
AST: 19 U/L (ref 15–41)
Albumin: 4.5 g/dL (ref 3.5–5.0)
Alkaline Phosphatase: 48 U/L (ref 38–126)
Anion gap: 9 (ref 5–15)
BUN: 15 mg/dL (ref 6–20)
CO2: 24 mmol/L (ref 22–32)
Calcium: 9.6 mg/dL (ref 8.9–10.3)
Chloride: 102 mmol/L (ref 98–111)
Creatinine, Ser: 0.76 mg/dL (ref 0.44–1.00)
GFR, Estimated: >60 mL/min (ref >60)
Glucose, Bld: 101 mg/dL — ABNORMAL HIGH (ref 70–99)
Potassium: 4.2 mmol/L (ref 3.5–5.1)
Sodium: 135 mmol/L (ref 135–145)
Total Bilirubin: 0.9 mg/dL (ref 0.0–1.2)
Total Protein: 8.1 g/dL (ref 6.5–8.1)

## 2023-09-06 NOTE — Assessment & Plan Note (Signed)
 The DCIS diagnosis and care plan were discussed with patient in detail.  We discussed that DCIS is a non invasive breast cancer,  cancer is only in the duct and has not spread into the tissues around it. She has high grade DCIS, there is about 10% chance of invasive component seen on final surgical resection specimen.  Recommend bilateral breast MRI w wo contrast due to her high breast density.  She has established care with surgeon and has not decided surgical options. She is interested in mastectomy although she has not fully decided yet. Dr. Rosea Conch is arrange her to establish care with plastic surgeon.

## 2023-09-06 NOTE — Progress Notes (Signed)
Accompanied patient  to initial medical oncology appointment.   Reviewed Breast Cancer treatment handbook.   Care plan summary given to patient.   Reviewed outreach programs and cancer center services.

## 2023-09-06 NOTE — Assessment & Plan Note (Signed)
Recommend genetic testing. Refer to counselor.

## 2023-09-06 NOTE — Progress Notes (Signed)
 Received message from RN Gwenn Lenz that Sheena [name] kit was drawn for urgent genetics.  TRF submitted for [facility] BRCAPlus panel w/ CancerNext+RNA Panel.  Genetics referral was placed by [place] team for urgent scheduling at [facility] (patient okay w/ telephone or MyChart appt)

## 2023-09-06 NOTE — Progress Notes (Signed)
 Hematology/Oncology Consult Note [phone] [phone]     REFERRING PROVIDER: Carolynn Citrin,*    CHIEF COMPLAINTS/PURPOSE OF CONSULTATION:  Left breast DCIS  ASSESSMENT & PLAN:   Cancer Staging  Ductal carcinoma in situ (DCIS) of left breast Staging form: Breast, AJCC 8th Edition - Clinical stage from 09/06/2023: Stage 0 (cTis (DCIS), cN0, cM0, G3) - Signed by Timmy Forbes, MD on 09/06/2023   Ductal carcinoma in situ (DCIS) of left breast The DCIS diagnosis and care plan were discussed with patient in detail.  We discussed that DCIS is a non invasive breast cancer,  cancer is only in the duct and has not spread into the tissues around it. She has high grade DCIS, there is about 10% chance of invasive component seen on final surgical resection specimen.  Recommend bilateral breast MRI w wo contrast due to her high breast density.  She has established care with surgeon and has not decided surgical options. She is interested in mastectomy although she has not fully decided yet. Dr. Rosea Conch is arrange her to establish care with plastic surgeon.    Family history of breast cancer Recommend genetic testing. Refer to counselor.    Orders Placed This Encounter  Procedures   MR BREAST BILATERAL W WO CONTRAST INC CAD    Standing Status:   Future    Expected Date:   09/13/2023    Expiration Date:   09/05/2024    If indicated for the ordered procedure, I authorize the administration of contrast media per Radiology protocol:   Yes    What is the patient's sedation requirement?:   No Sedation    Does the patient have a pacemaker or implanted devices?:   No    Radiology Contrast Protocol - do NOT remove file path:   [email or web address]    Preferred imaging location?:   [facility] [place] (table limit - 550lbs)   Comprehensive metabolic panel    Standing Status:   Future    Number of Occurrences:   1    Expected Date:    09/06/2023    Expiration Date:   09/05/2024   CBC with Differential/Platelet    Standing Status:   Future    Number of Occurrences:   1    Expected Date:   09/06/2023    Expiration Date:   09/05/2024   Ambulatory referral to Genetics    Referral Priority:   Routine    Referral Type:   Consultation    Referral Reason:   Specialty Services Required    Number of Visits Requested:   1   Follow up TBD All questions were answered. The patient knows to call the clinic with any problems, questions or concerns.  Timmy Forbes, MD, PhD [facility] Hematology Oncology 09/06/2023    HISTORY OF PRESENTING ILLNESS:  Sheena [name] 43 y.o. female presents to establish care for left breast high grade DCIS I have reviewed her chart and materials related to her cancer extensively and collaborated history with the patient. Summary of oncologic history is as follows: Oncology History  Ductal carcinoma in situ (DCIS) of left breast  07/31/2023 Mammogram   Screening mammogram -Further evaluation is suggested for calcifications in the left breast.   08/25/2023 Imaging   Left breast mammogram IMPRESSION: LEFT breast 12 mm group of amorphous calcification in the outer central position is low suspicion for malignancy. Recommend further assessment with stereotactic guided biopsy.   RECOMMENDATION: LEFT breast stereotactic guided biopsy (1 site)   08/31/2023  Initial Diagnosis   Ductal carcinoma in situ (DCIS) of left breast  Patient underwent left breast biopsy  1. Breast, left, needle core biopsy, lower outer :       - HIGH-GRADE DUCTAL CARCINOMA IN SITU (DCIS), COMEDO-TYPE WITH CALCIFICATION    Menarche at age of 32 First live birth at age of 43 OCP use: no Menopausal status: having period  History of HRT use: No History of chest radiation: NO Number of previous breast biopsies:  No    09/06/2023 Cancer Staging   Staging form: Breast, AJCC 8th Edition - Clinical stage from 09/06/2023: Stage 0 (cTis  (DCIS), cN0, cM0, G3) - Signed by Timmy Forbes, MD on 09/06/2023 Stage prefix: Initial diagnosis Histologic grading system: 3 grade system    Patient presents to establish care.  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, hpv+    SURGICAL HISTORY: Past Surgical History:  Procedure Laterality Date   BREAST BIOPSY Left 08/31/2023   left breast stereo calcs, coil clip, path pending   BREAST BIOPSY Left 08/31/2023   MM LT BREAST BX W LOC DEV 1ST LESION IMAGE BX SPEC STEREO GUIDE 08/31/2023 [facility]   CESAREAN SECTION  2005; 2015   [facility] x2   CESAREAN SECTION      SOCIAL HISTORY: Social History   Socioeconomic History   Marital status: Single    Spouse name: Not on file   Number of children: 2   Years of education: Not on file   Highest education level: Not on file  Occupational History   Not on file  Tobacco Use   Smoking status: Never   Smokeless tobacco: Never  Vaping Use   Vaping status: Never Used  Substance and Sexual Activity   Alcohol use: Never   Drug use: Never   Sexual activity: Not Currently    Birth control/protection: None  Other Topics Concern   Not on file  Social History Narrative   Not on file   Social Drivers of Health   Financial Resource Strain: Low Risk  (07/13/2023)   [facility]   Overall Financial Resource Strain (CARDIA)    Difficulty of Paying Living Expenses: Not hard at all  Food Insecurity: No Food Insecurity (07/13/2023)   [facility]   Hunger Vital Sign    Worried About Running Out of Food in the Last Year: Never true    Ran Out of Food in the Last Year: Never true  Transportation Needs: Not on file  Physical Activity: Not on file  Stress: Not on file  Social Connections: Not on file  Intimate Partner Violence: Not on file    FAMILY  HISTORY: Family History  Problem Relation Age of Onset   Cancer Mother        breast   Breast cancer Mother 72   Cancer Father 65       prostate   Prostate cancer Father     ALLERGIES:  has no known allergies.  MEDICATIONS:  Current Outpatient Medications  Medication Sig Dispense Refill   Multiple Vitamin (MULTIVITAMIN) tablet Take 1 tablet by mouth daily.     ondansetron  (ZOFRAN -ODT) 4 MG disintegrating tablet Take 1 tablet (4 mg total) by mouth every 8 (eight) hours as needed for nausea or vomiting. 20 tablet 0   No current facility-administered medications for  this visit.    Review of Systems  Constitutional:  Negative for appetite change, chills, fatigue and fever.  HENT:   Negative for hearing loss and voice change.   Eyes:  Negative for eye problems.  Respiratory:  Negative for chest tightness and cough.   Cardiovascular:  Negative for chest pain.  Gastrointestinal:  Negative for abdominal distention, abdominal pain and blood in stool.  Endocrine: Negative for hot flashes.  Genitourinary:  Negative for difficulty urinating and frequency.   Musculoskeletal:  Negative for arthralgias.  Skin:  Negative for itching and rash.  Neurological:  Negative for extremity weakness.  Hematological:  Negative for adenopathy.  Psychiatric/Behavioral:  Negative for confusion.      PHYSICAL EXAMINATION: ECOG PERFORMANCE STATUS: 0 - Asymptomatic  Vitals:   09/06/23 1131  BP: 120/76  Pulse: 78  Resp: 18  Temp: 97.9 F (36.6 C)   Filed Weights   09/06/23 1131  Weight: 134 lb 9.6 oz (61.1 kg)    Physical Exam Constitutional:      General: She is not in acute distress.    Appearance: She is not diaphoretic.  HENT:     Head: Normocephalic and atraumatic.  Eyes:     General: No scleral icterus. Cardiovascular:     Rate and Rhythm: Normal rate and regular rhythm.     Heart sounds: No murmur heard. Pulmonary:     Effort: Pulmonary effort is normal. No respiratory  distress.     Breath sounds: No wheezing.  Abdominal:     General: There is no distension.     Palpations: Abdomen is soft.     Tenderness: There is no abdominal tenderness.  Musculoskeletal:        General: Normal range of motion.     Cervical back: Normal range of motion and neck supple.  Skin:    General: Skin is warm and dry.     Findings: No erythema.  Neurological:     Mental Status: She is alert and oriented to person, place, and time. Mental status is at baseline.     Cranial Nerves: No cranial nerve deficit.     Motor: No abnormal muscle tone.  Psychiatric:        Mood and Affect: Affect normal.     Comments: Tearful    Patient declined breast examination as she was examined by Dr. Rosea Conch yesterday  LABORATORY DATA:  I have reviewed the data as listed    Latest Ref Rng & Units 09/06/2023   12:21 PM 02/04/2021    7:51 AM 01/02/2014    2:10 AM  CBC  WBC 4.0 - 10.5 K/uL 5.7  5.8  7.5   Hemoglobin 12.0 - 15.0 g/dL 29.5  28.4  13.2   Hematocrit 36.0 - 46.0 % 38.0  34.9  39.1   Platelets 150 - 400 K/uL 407  355  400       Latest Ref Rng & Units 09/06/2023   12:21 PM 02/04/2021    7:51 AM 01/02/2014    2:10 AM  CMP  Glucose 70 - 99 mg/dL 440  80  96   BUN 6 - 20 mg/dL 15  10  4    Creatinine 0.44 - 1.00 mg/dL 1.02  7.25  3.66   Sodium 135 - 145 mmol/L 135  140  139   Potassium 3.5 - 5.1 mmol/L 4.2  4.1  3.1   Chloride 98 - 111 mmol/L 102  100  103   CO2 22 - 32  mmol/L 24  22  24    Calcium 8.9 - 10.3 mg/dL 9.6  13.2  9.7   Total Protein 6.5 - 8.1 g/dL 8.1  7.7  8.4   Total Bilirubin 0.0 - 1.2 mg/dL 0.9  0.2  0.7   Alkaline Phos 38 - 126 U/L 48  53  76   AST 15 - 41 U/L 19  24  24    ALT 0 - 44 U/L 27  25  23       RADIOGRAPHIC STUDIES: I have personally reviewed the radiological images as listed and agreed with the findings in the report. MM LT BREAST BX W LOC DEV 1ST LESION IMAGE BX SPEC STEREO GUIDE Addendum Date: 09/04/2023 ADDENDUM REPORT: 09/04/2023 16:09  ADDENDUM: PATHOLOGY revealed: 1. Breast, left, needle core biopsy, lower outer : - HIGH-GRADE DUCTAL CARCINOMA IN SITU (DCIS), COMEDO-TYPE WITH CALCIFICATION. Pathology results are CONCORDANT with imaging findings, per Dr. Allena Ito. Pathology results and recommendations were discussed with patient via telephone on 09/01/2023 by Ladonna Pickup RN. Patient reported biopsy site doing well with no adverse symptoms, and only slight tenderness at the site. Post biopsy care instructions were reviewed, questions were answered and my direct phone number was provided. Patient was instructed to call [facility] for any additional questions or concerns related to biopsy site. RECOMMENDATIONS: 1. Surgical and oncological consultation. Request for surgical and oncological consultation relayed to Gwenn Lenz RN at [facility] by Ladonna Pickup RN on 09/01/2023. 2. Recommend pretreatment bilateral breast MRI with and without contrast to determine extent of breast disease given diagnosis of high grade DCIS. Pathology results reported by Ladonna Pickup RN on 09/04/2023. Electronically Signed   By: Allena Ito M.D.   On: 09/04/2023 16:09   Result Date: 09/04/2023 CLINICAL DATA:  43 year old female presenting for biopsy of calcifications in the left breast. EXAM: LEFT BREAST STEREOTACTIC CORE NEEDLE BIOPSY COMPARISON:  Previous exam(s). FINDINGS: The patient and I discussed the procedure of stereotactic-guided biopsy including benefits and alternatives. We discussed the high likelihood of a successful procedure. We discussed the risks of the procedure including infection, bleeding, tissue injury, clip migration, and inadequate sampling. Informed written consent was given. The usual time out protocol was performed immediately prior to the procedure. Using sterile technique and 1% Lidocaine  as local anesthetic, under stereotactic guidance, a 9 gauge vacuum assisted device was used to perform core needle  biopsy of calcifications in the lower outer left breast using a lateral approach. Specimen radiograph was performed showing several specimens with calcifications. Specimens with calcifications are identified for pathology. Lesion quadrant: Lower outer quadrant At the conclusion of the procedure, a coil shaped tissue marker clip was deployed into the biopsy cavity. Follow-up 2-view mammogram was performed and dictated separately. IMPRESSION: Stereotactic-guided biopsy of calcifications in the lower outer left breast. No apparent complications. Electronically Signed: By: Allena Ito M.D. On: 08/31/2023 08:50   MM CLIP PLACEMENT LEFT Result Date: 08/31/2023 CLINICAL DATA:  Post procedure mammogram for clip placement EXAM: 3D DIAGNOSTIC LEFT MAMMOGRAM POST STEREOTACTIC BIOPSY COMPARISON:  Previous exam(s). FINDINGS: 3D Mammographic images were obtained following stereotactic guided biopsy of calcifications in the lower outer left breast. The coil biopsy marking clip is in expected position at the site of biopsy. IMPRESSION: Appropriate positioning of the coil biopsy marking clip at the site of biopsy in the lower outer left breast. Final Assessment: Post Procedure Mammograms for Marker Placement Electronically Signed   By: Allena Ito M.D.   On: 08/31/2023  08:49   MM 3D DIAGNOSTIC MAMMOGRAM UNILATERAL LEFT BREAST Result Date: 08/25/2023 CLINICAL DATA:  Screening recall for possible left breast calcifications EXAM: DIGITAL DIAGNOSTIC UNILATERAL LEFT MAMMOGRAM WITH TOMOSYNTHESIS AND CAD TECHNIQUE: Left digital diagnostic mammography and breast tomosynthesis was performed. The images were evaluated with computer-aided detection. COMPARISON:  Previous exam(s). ACR Breast Density Category d: The breasts are extremely dense, which lowers the sensitivity of mammography. FINDINGS: Spot magnification views demonstrate a 12 mm group of amorphous calcification in the outer central left breast middle depth. This  corresponds with the screening mammogram finding and is new compared to priors. No additional suspicious findings are identified in the left breast. IMPRESSION: LEFT breast 12 mm group of amorphous calcification in the outer central position is low suspicion for malignancy. Recommend further assessment with stereotactic guided biopsy. RECOMMENDATION: LEFT breast stereotactic guided biopsy (1 site). I have discussed the findings and recommendations with the patient. The biopsy procedure was discussed with the patient and questions were answered. Patient expressed their understanding of the biopsy recommendation. Patient will be scheduled for biopsy at her earliest convenience by the schedulers. Ordering provider will be notified. If applicable, a reminder letter will be sent to the patient regarding the next appointment. BI-RADS CATEGORY  4: Suspicious. Electronically Signed   By: Sande Cromer M.D.   On: 08/25/2023 16:50

## 2023-09-08 ENCOUNTER — Other Ambulatory Visit: Payer: Self-pay | Admitting: *Deleted

## 2023-09-08 DIAGNOSIS — D0512 Intraductal carcinoma in situ of left breast: Secondary | ICD-10-CM

## 2023-09-11 ENCOUNTER — Encounter: Payer: Self-pay | Admitting: Genetic Counselor

## 2023-09-11 ENCOUNTER — Ambulatory Visit: Payer: BC Managed Care – PPO

## 2023-09-11 ENCOUNTER — Ambulatory Visit
Admission: RE | Admit: 2023-09-11 | Discharge: 2023-09-11 | Disposition: A | Discharge status: Home / Self Care | Payer: BC Managed Care – PPO | Source: Ambulatory Visit | Attending: Surgery | Admitting: Surgery

## 2023-09-11 ENCOUNTER — Inpatient Hospital Stay (HOSPITAL_BASED_OUTPATIENT_CLINIC_OR_DEPARTMENT_OTHER): Payer: BC Managed Care – PPO | Admitting: Genetic Counselor

## 2023-09-11 DIAGNOSIS — D0512 Intraductal carcinoma in situ of left breast: Secondary | ICD-10-CM | POA: Insufficient documentation

## 2023-09-11 DIAGNOSIS — Z803 Family history of malignant neoplasm of breast: Secondary | ICD-10-CM

## 2023-09-11 DIAGNOSIS — Z8042 Family history of malignant neoplasm of prostate: Secondary | ICD-10-CM | POA: Diagnosis not present

## 2023-09-11 MED ORDER — GADOBUTROL 1 MMOL/ML IV SOLN
6.0000 mL | Freq: Once | INTRAVENOUS | Status: AC | PRN
  Administered 2023-09-11: 6 mL via INTRAVENOUS

## 2023-09-11 NOTE — Progress Notes (Signed)
REFERRING PROVIDER: Lorre Munroe, NP [address] [place],  [place] [address]  PRIMARY PROVIDER:  Lorre Munroe, NP  PRIMARY REASON FOR VISIT:  1. Ductal carcinoma in situ (DCIS) of left breast   2. Family history of breast cancer   3. Family history of malignant neoplasm of prostate      HISTORY OF PRESENT ILLNESS:   Sheena [name], a 43 y.o. female, was seen for a [facility] cancer genetics consultation at the request of Dr. Sampson Si due to a personal and family history of cancer.  Sheena [name] presents to clinic today to discuss the possibility of a hereditary predisposition to cancer, genetic testing, and to further clarify her future cancer risks, as well as potential cancer risks for family members. Her mother was present for a portion of this visit by phone.   Sheena [name] was recently diagnosed with left DCIS at the age of 65. Genetic testing requested urgently to help facilitate treatment decisions. Her oncologist's office has already drawn testing for [employer]   CANCER HISTORY:  Oncology History  Ductal carcinoma in situ (DCIS) of left breast  07/31/2023 Mammogram   Screening mammogram -Further evaluation is suggested for calcifications in the left breast.   08/25/2023 Imaging   Left breast mammogram IMPRESSION: LEFT breast 12 mm group of amorphous calcification in the outer central position is low suspicion for malignancy. Recommend further assessment with stereotactic guided biopsy.   RECOMMENDATION: LEFT breast stereotactic guided biopsy (1 site)   08/31/2023 Initial Diagnosis   Ductal carcinoma in situ (DCIS) of left breast  Patient underwent left breast biopsy  1. Breast, left, needle core biopsy, lower outer :       - HIGH-GRADE DUCTAL CARCINOMA IN SITU (DCIS), COMEDO-TYPE WITH CALCIFICATION    Menarche at age of 46 First live birth at age of 71 OCP use: no Menopausal status: having period  History of HRT use: No History of chest radiation: NO Number of previous  breast biopsies:  No    09/06/2023 Cancer Staging   Staging form: Breast, AJCC 8th Edition - Clinical stage from 09/06/2023: Stage 0 (cTis (DCIS), cN0, cM0, G3) - Signed by Rickard Patience, MD on 09/06/2023 Stage prefix: Initial diagnosis Histologic grading system: 3 grade system     RISK FACTORS:  Menarche was at age 63.  First live birth at age 89.  History of Depo shot x2.  Ovaries intact: yes.  Hysterectomy: no.  Menopausal status: premenopausal.  HRT use: 0 years. Colonoscopy: no; not examined. Mammogram within the last year: yes. Number of breast biopsies: 1. Up to date with pelvic exams: yes. 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, hpv+    Past Surgical History:  Procedure Laterality Date   BREAST BIOPSY Left 08/31/2023   left breast stereo calcs, coil clip, path pending   BREAST BIOPSY Left 08/31/2023   MM LT BREAST BX W LOC DEV 1ST LESION IMAGE BX SPEC STEREO GUIDE 08/31/2023 [facility]   CESAREAN SECTION  2005; 2015   [facility] x2   CESAREAN SECTION      Social History   Socioeconomic History   Marital status: Single    Spouse name: Not on file   Number of children: 2   Years of education: Not on file   Highest education level: Not on file  Occupational History   Not on  file  Tobacco Use   Smoking status: Never   Smokeless tobacco: Never  Vaping Use   Vaping status: Never Used  Substance and Sexual Activity   Alcohol use: Never   Drug use: Never   Sexual activity: Not Currently    Birth control/protection: None  Other Topics Concern   Not on file  Social History Narrative   Not on file   Social Drivers of Health   Financial Resource Strain: Low Risk  (07/13/2023)   Received from [facility]   Overall Financial Resource Strain (CARDIA)    Difficulty of Paying Living Expenses: Not hard at all  Food Insecurity: No Food  Insecurity (07/13/2023)   Received from [facility]   Hunger Vital Sign    Worried About Running Out of Food in the Last Year: Never true    Ran Out of Food in the Last Year: Never true  Transportation Needs: Not on file  Physical Activity: Not on file  Stress: Not on file  Social Connections: Not on file     FAMILY HISTORY:  We obtained a detailed, 4-generation family history.  Significant diagnoses are listed below: Family History  Problem Relation Age of Onset   Cancer Mother        breast   Breast cancer Mother 100   Cancer Father 107 - 65       prostate   Prostate cancer Father    Cancer Maternal Aunt 54       metastatic, unknown primary   Cancer Maternal Aunt 52       gallbladder    Ms. Cutlip reports her mother was diagnosed with breast cancer at age 39, living at age 94. 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 Her mother provided permission to access her results via teleconference. Her maternal aunt was diagnosed with metastatic cancer, unknown primary, and passed away at age 80. Her maternal aunt was diagnosed with gallbladder cancer at age 36, passed away at age 59. Her father was diagnosed with prostate cancer in his late 74s or early 81s, living at age 47. There is no reported Ashkenazi Jewish ancestry. There is no known consanguinity.     GENETIC COUNSELING ASSESSMENT: Sheena [name] is a 43 y.o. 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 We, therefore, discussed and recommended the following at today's visit.   DISCUSSION: We discussed that, in general, most cancer is not inherited in families, but instead is sporadic or  familial. Sporadic cancers occur by chance and typically happen at older ages (>50 years) as this type of cancer is caused by genetic changes acquired during an individual's lifetime. Some families have more cancers than would be expected by chance; however, the ages or types of cancer are not consistent with a known genetic mutation or known genetic mutations have been ruled out. This type of familial cancer is thought to be due to a combination of multiple genetic, environmental, hormonal, and lifestyle factors. While this combination of factors likely increases the risk of cancer, the exact source of this risk is not currently identifiable or testable.  We discussed that 5 - 10% of cancer is hereditary. We discussed that testing is beneficial for several  reasons including knowing how to follow individuals after completing their treatment, identifying whether potential treatment options such as PARP inhibitors would be beneficial, and understand if other family members could be at risk for cancer and allow them to undergo genetic testing.   We reviewed the characteristics, features and inheritance patterns of hereditary cancer syndromes. We also discussed genetic testing, including the appropriate family members to test, the process of testing, insurance coverage and turn-around-time for results. We discussed the implications of a negative, positive, carrier and/or variant of uncertain significant result. Sheena [name]  was offered a common hereditary cancer panel (36+ genes) and an expanded pan-cancer panel (70+ genes). 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 We discussed that it would be recommended to cancel the [facility] testing drawn by her oncologist and redraw testing for [facility], given that [facility] last classified the c.190G>A variant as uncertain in early 2024. We discussed the challenges  associated with discrepant variant classification, but Sheena [name] declined to cancel testing through [facility], due to concerns for timeliness of result. We will revisit the option for testing through [facility] at a later date.   Sheena [name] was informed of the benefits and limitations of each panel, including that expanded pan-cancer panels contain genes that do not have clear management guidelines at this point in time.  We also discussed that as the number of genes included on a panel increases, the chances of variants of uncertain significance increases. Sheena [name] decided to pursue genetic testing for the [facility] BRCAPlus and CancerNext +RNAinsight gene panels.   Based on Sheena [name]'s personal and family history of cancer, she meets medical criteria for genetic testing. Despite that she meets criteria, she may still have an out of pocket cost. We discussed that if her out of pocket cost for testing is over $100, the laboratory will call and confirm whether she wants to proceed with testing.  If the out of pocket cost of testing is less than $100 she will be billed by the genetic testing laboratory.   We discussed that some people do not want to undergo genetic testing due to fear of genetic discrimination.  The Genetic Information Nondiscrimination Act (GINA) was signed into federal law in 2008. GINA prohibits health insurers and most employers from discriminating against individuals based on genetic information (including the results of genetic tests and family history information). According to GINA, health insurance companies cannot consider genetic information to be a preexisting condition, nor can they use it to make decisions regarding coverage or rates. GINA also makes it illegal for most employers to use genetic information in making decisions about hiring, firing, promotion, or terms of employment. It is important to note that GINA does not offer protections for life insurance, disability insurance, or  long-term care insurance. GINA does not apply to those in the [employer], those who work for companies with less than 15 employees, and new life insurance or long-term disability insurance policies.  Health status due to a cancer diagnosis is not protected under GINA. More information about GINA can be found by visiting [email or web address].  We reviewed the characteristics, features and inheritance patterns of hereditary cancer syndromes. We also discussed genetic testing, including the appropriate family members to test, the process of testing, insurance coverage and turn-around-time for results. We discussed the implications of a negative, positive and/or variant of uncertain significant result. In order to get genetic test results in a timely  manner so that Sheena [name] can use these genetic test results for surgical decisions, we will order [facility]'s BRCAPlus (7-10d TAT) and CancerNext +RNAinsight (2-3 w TAT). Once complete, we recommend Sheena [name] consider single gene CHEK2 testing through [facility].   PLAN: After considering the risks, benefits, and limitations, Sheena [name] provided informed consent to pursue genetic testing. Blood sample was previously drawn by her oncologist and sent to [facility]. Results should be available within approximately 7-10 days for STAT test, 2-3 weeks for full panel, which point they will be disclosed by telephone to Sheena [name], as will any additional recommendations warranted by these results. Sheena [name] will receive a summary of her genetic counseling visit and a copy of her results once available. This information will also be available in Epic.   Lastly, we encouraged Sheena [name] to remain in contact with cancer genetics annually so that we can continuously update the family history and inform her of any changes in cancer genetics and testing that may be of benefit for this family.   Sheena [name] questions were answered to her satisfaction today. Our contact information was provided should  additional questions or concerns arise. Thank you for the referral and allowing [place] to share in the care of your patient.   Vassie Moment, MS, [facility] Licensed, Retail [email or web address] phone: [phone]  50 minutes were spent on the date of the encounter in service to the patient including preparation, face-to-face consultation, documentation and care coordination.  The patient's mother was present for a portion of this visit by phone. Drs. Meliton Rattan, and/or [place] were available for questions, if needed..   I connected with  Sheena [name] on 09/11/2023 at 2 pm EDT by telephone conference and verified that I am speaking with the correct person using two identifiers.   Patient location: [facility] [place] Provider location: [facility]  _______________________________________________________________________ For Office Staff:  Number of people involved in session:  Was an Intern/ student involved with case: no

## 2023-09-18 ENCOUNTER — Encounter: Payer: Self-pay | Admitting: *Deleted

## 2023-09-18 NOTE — Progress Notes (Signed)
Ms. Attwood called and said she has decided she would like to have nipple sparing mastectomy.   She said Dr. Tonna Boehringer doesn't do those surgeries and he offered to refer her.   She looked into the surgeons at [facility] and is interested in Dr. Johnney Ou.   I notified Dr. Tonna Boehringer and he would like her to still meet with Dr. Ulice Bold tomorrow and if she is a candidate for nipple sparing mastectomy, he will refer her to Dr. Johnney Ou per her request.

## 2023-09-19 ENCOUNTER — Ambulatory Visit: Payer: BC Managed Care – PPO | Admitting: Plastic Surgery

## 2023-09-19 ENCOUNTER — Encounter: Payer: Self-pay | Admitting: Plastic Surgery

## 2023-09-19 ENCOUNTER — Encounter: Payer: Self-pay | Admitting: *Deleted

## 2023-09-19 VITALS — BP 128/85 | HR 83 | Wt 135.0 lb

## 2023-09-19 DIAGNOSIS — Z803 Family history of malignant neoplasm of breast: Secondary | ICD-10-CM | POA: Diagnosis not present

## 2023-09-19 DIAGNOSIS — D0512 Intraductal carcinoma in situ of left breast: Secondary | ICD-10-CM | POA: Diagnosis not present

## 2023-09-19 NOTE — Progress Notes (Signed)
Patient ID: Sheena [name], female    DOB: 1981/01/23, 43 y.o.   MRN: [id number]   Chief Complaint  Patient presents with   Advice Only   Breast Problem   Breast Cancer    The patient is a 43 year old female here for a consultation for breast reconstruction.  She is 5 feet 5 inches tall and weighs 135 pounds.  She was recently diagnosed with left-sided ductal carcinoma in situ and is currently seeing Dr. Tonna Boehringer at [facility].  The patient is not a smoker and does not have diabetes.  She is hoping to save her nipples if possible.  She does have a strong family history with her mom having breast cancer.  The patient has been genetically tested and is waiting for the results.  According to the oncology note the DCIS is noninvasive and has not spread into the tissue around the duct.  It is high grade and there is a 10% chance of invasive component based on the surgical resection specimen.  Her previous surgical history includes a C-section.  The patient has grade 3-4 ptosis of her breast so nipple sparing technique would not have an aesthetically pleasing result if she decides to do mastectomies.    Review of Systems  Constitutional:  Negative for activity change and appetite change.  Eyes: Negative.   Respiratory: Negative.  Negative for chest tightness and shortness of breath.   Cardiovascular: Negative.   Gastrointestinal: Negative.   Endocrine: Negative.   Genitourinary: Negative.   Musculoskeletal: Negative.   Skin: Negative.     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, hpv+    Past Surgical History:  Procedure Laterality Date   BREAST BIOPSY Left 08/31/2023   left breast stereo calcs, coil clip, path pending   BREAST BIOPSY Left 08/31/2023   MM LT BREAST BX W LOC DEV 1ST LESION IMAGE BX SPEC STEREO GUIDE 08/31/2023 [facility]   CESAREAN SECTION  2005; 2015   [facility] x2   CESAREAN SECTION        Current Outpatient  Medications:    Multiple Vitamin (MULTIVITAMIN) tablet, Take 1 tablet by mouth daily., Disp: , Rfl:    ondansetron (ZOFRAN-ODT) 4 MG disintegrating tablet, Take 1 tablet (4 mg total) by mouth every 8 (eight) hours as needed for nausea or vomiting., Disp: 20 tablet, Rfl: 0   Objective:   Vitals:   09/19/23 0859  BP: 128/85  Pulse: 83  SpO2: 100%    Physical Exam Vitals and nursing note reviewed.  Constitutional:      Appearance: Normal appearance.  HENT:     Head: Atraumatic.  Cardiovascular:     Rate and Rhythm: Normal rate.     Pulses: Normal pulses.  Pulmonary:     Effort: Pulmonary effort is normal.  Abdominal:     General: There is no distension.     Palpations: Abdomen is soft.     Tenderness: There is no abdominal tenderness.  Musculoskeletal:        General: No swelling or deformity.  Skin:    General: Skin is warm.  Neurological:     Mental Status: She is alert and oriented to person, place, and time.  Psychiatric:        Mood and Affect: Mood normal.        Behavior: Behavior normal.        Thought Content: Thought content normal.  Judgment: Judgment normal.     Assessment & Plan:  Ductal carcinoma in situ (DCIS) of left breast  Family history of breast cancer  The options for reconstruction we explained to the patient / family for breast reconstruction.  There are two general categories of reconstruction.  We can reconstruction a breast with implants or use the patient's own tissue.  These were further discussed as listed.  Breast reconstruction is an optional procedure and eligibility depends on the full spectrum of the health of the patient and any co-morbidities.  More than one surgery is often needed to complete the reconstruction process.  The process can take three to twelve months to complete.  The breasts will not be identical due to many factors such as rib differences, shoulder asymmetry and treatments such as radiation.  The goal is to get  the breasts to look normal and symmetrical in clothes.  Scars are a part of surgery and may fade some in time but will always be present under clothes.  Surgery may be an option on the non-cancer breast to achieve more symmetry.  No matter which procedure is chosen there is always the risk of complications and even failure of the body to heal.  This could result in no breast.    The options for reconstruction include:  1. Placement of a tissue expander with Acellular dermal matrix. When the expander is the desired size surgery is performed to remove the expander and place an implant.  In some cases the implant can be placed without an expander.  2. Autologous reconstruction can include using a muscle or tissue from another area of the body to create a breast.  3. Combined procedures (ie. latissismus dorsi flap) can be done with an expander / implant placed under the muscle.   The risks, benefits, scars and recovery time were discussed for each of the above. Risks include bleeding, infection, hematoma, seroma, scarring, pain, wound healing complications, flap loss, fat necrosis, capsular contracture, need for implant removal, donor site complications, bulge, hernia, umbilical necrosis, need for urgent reoperation, and need for dressing changes.   The procedure the patient selected / that was best for the patient, was then discussed in further detail.  Total time: 45 minutes. This includes time spent with the patient during the visit as well as time spent before and after the visit reviewing the chart, documenting the encounter, making phone calls and reviewing studies.   If the patient's genetic testing comes back negative then she would like to have oncoplastic breast reduction at the time of her left partial mastectomy.  If the genetics comes back positive we will have to revisit her options from a reconstructive standpoint since she would most likely have bilateral mastectomies.  Will plan to talk next  week when the genetics results should be back.  Current plan is for bilateral oncoplastic breast reduction.  Pictures were obtained of the patient and placed in the chart with the patient's or guardian's permission.  I have spoken with Dr. Tonna Boehringer and we are in agreement to move forward with oncoplastic breast reduction provided her BRCA is negative.  Alena Bills Tavi Gaughran, DO

## 2023-09-19 NOTE — Progress Notes (Signed)
Sheena [name] met with plastic surgeon and has decided to proceed with lumpectomy pending genetic testing.   Hopefully the results will be back in next day or 2.

## 2023-09-20 ENCOUNTER — Telehealth: Payer: Self-pay | Admitting: Genetic Counselor

## 2023-09-20 NOTE — Telephone Encounter (Signed)
Patient calling to check on results of genetic testing. BRCAPlus STAT panel and CancerNext panel are still running. Will contact Jasemine as soon as results are available.

## 2023-09-22 ENCOUNTER — Encounter: Payer: Self-pay | Admitting: *Deleted

## 2023-09-22 NOTE — Progress Notes (Signed)
Sheena [name] called to ask if I had heard anything about her genetic testing results.   I reached out to the genetic counselor Tresa Endo and she said the test is still running.   She will hopefully have the results beginning of next week and will call her as soon as she has the results.

## 2023-09-25 ENCOUNTER — Ambulatory Visit: Payer: Self-pay | Admitting: Genetic Counselor

## 2023-09-25 ENCOUNTER — Encounter: Payer: Self-pay | Admitting: Genetic Counselor

## 2023-09-25 ENCOUNTER — Telehealth: Payer: Self-pay | Admitting: Genetic Counselor

## 2023-09-25 DIAGNOSIS — Z1379 Encounter for other screening for genetic and chromosomal anomalies: Secondary | ICD-10-CM | POA: Insufficient documentation

## 2023-09-25 NOTE — Telephone Encounter (Signed)
I spoke to Sheena [name] to review results of genetic testing. She had genetic testing with [facility] BRCAPlus and CancerNext +RNAinsight panels. Testing did not identify any variants known to increase the risk for cancer. Specifically did not identify the CHEK2 c.190G>A likely pathogenic variant that had been reported for her mom (testing through [facility]). Discussed that we do not know why she has breast cancer or why there is cancer in the family. It could be due to a different gene that we are not testing, or maybe our current technology may not be able to pick something up.  It will be important for her to keep in contact with genetics to keep up with whether additional testing may be needed.  Please see counseling note for further detail on this result.

## 2023-09-25 NOTE — Progress Notes (Signed)
HPI:  Ms. Farnell was previously seen in the [facility] due to a personal and family history of cancer and concerns regarding a hereditary predisposition to cancer. Please refer to our prior cancer genetics clinic note for more information regarding our discussion, assessment and recommendations, at the time. Ms. Corona recent genetic test results were disclosed to her, as were recommendations warranted by these results. These results and recommendations are discussed in more detail below.  CANCER HISTORY:  Oncology History  Ductal carcinoma in situ (DCIS) of left breast  07/31/2023 Mammogram   Screening mammogram -Further evaluation is suggested for calcifications in the left breast.   08/25/2023 Imaging   Left breast mammogram IMPRESSION: LEFT breast 12 mm group of amorphous calcification in the outer central position is low suspicion for malignancy. Recommend further assessment with stereotactic guided biopsy.   RECOMMENDATION: LEFT breast stereotactic guided biopsy (1 site)   08/31/2023 Initial Diagnosis   Ductal carcinoma in situ (DCIS) of left breast  Patient underwent left breast biopsy  1. Breast, left, needle core biopsy, lower outer :       - HIGH-GRADE DUCTAL CARCINOMA IN SITU (DCIS), COMEDO-TYPE WITH CALCIFICATION    Menarche at age of 31 First live birth at age of 25 OCP use: no Menopausal status: having period  History of HRT use: No History of chest radiation: NO Number of previous breast biopsies:  No    09/06/2023 Cancer Staging   Staging form: Breast, AJCC 8th Edition - Clinical stage from 09/06/2023: Stage 0 (cTis (DCIS), cN0, cM0, G3) - Signed by Rickard Patience, MD on 09/06/2023 Stage prefix: Initial diagnosis Histologic grading system: 3 grade system    Genetic Testing   Negative/normal genetic testing with [facility] 39 gene CancerNext +RNAinsight panel. The [facility] CancerNext+RNAinsight Panel includes sequencing, rearrangement analysis, and RNA  analysis for the following 39 genes: APC, ATM, BAP1, BARD1, BMPR1A, BRCA1, BRCA2, BRIP1, CDH1, CDKN2A, CHEK2, FH, FLCN, MET, MLH1, MSH2, MSH6, MUTYH, NF1, NTHL1, PALB2, PMS2, PTEN, RAD51C, RAD51D, SMAD4, STK11, TP53, TSC1, TSC2, and VHL (sequencing and deletion/duplication); AXIN2, HOXB13, MBD4, MSH3, POLD1 and POLE (sequencing only); EPCAM and GREM1 (deletion/duplication only). Report date 09/23/23.      FAMILY HISTORY:  We obtained a detailed, 4-generation family history.  Significant diagnoses are listed below: Family History  Problem Relation Age of Onset   Cancer Mother        breast   Breast cancer Mother 48   Cancer Father 61 - 27       prostate   Prostate cancer Father    Cancer Maternal Aunt 25       metastatic, unknown primary   Cancer Maternal Aunt 64       gallbladder    Ms. Oetken reports her mother was diagnosed with breast cancer at age 52, living at age 62. 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 Her mother provided permission to access her results via teleconference. Her maternal aunt was diagnosed with metastatic cancer, unknown primary, and passed away at age 56. Her maternal aunt was diagnosed with gallbladder cancer at age 42, passed away at age 58. Her father was diagnosed with prostate cancer in his late 28s or early 36s, living at age 38. There is no reported Ashkenazi Jewish ancestry. There is no known consanguinity.     GENETIC TEST RESULTS: Genetic testing reported out on 09/23/2023 through the  [facility] and CancerNext +RNAinsight cancer panel found no pathogenic mutations. The [facility] CancerNext+RNAinsight Panel includes sequencing, rearrangement analysis, and RNA analysis for the following 39 genes: APC, ATM, BAP1, BARD1, BMPR1A, BRCA1, BRCA2, BRIP1, CDH1, CDKN2A, CHEK2, FH, FLCN, MET, MLH1, MSH2, MSH6, MUTYH, NF1, NTHL1, PALB2,  PMS2, PTEN, RAD51C, RAD51D, SMAD4, STK11, TP53, TSC1, TSC2, and VHL (sequencing and deletion/duplication); AXIN2, HOXB13, MBD4, MSH3, POLD1 and POLE (sequencing only); EPCAM and GREM1 (deletion/duplication only). The test report has been scanned into EPIC and is located under the Molecular Pathology section of the Results Review tab.  A portion of the result report is included below for reference.     We discussed with Ms. Wojdyla that because current genetic testing is not perfect, it is possible there may be a gene mutation in one of these genes that current testing cannot detect, but that chance is small.  We also discussed, that there could be another gene that has not yet been discovered, or that we have not yet tested, that is responsible for the cancer diagnoses in the family. It is also possible there is a hereditary cause for the cancer in the family that Ms. Adamczak did not inherit and therefore was not identified in her testing.  Therefore, it is important to remain in touch with cancer genetics in the future so that we can continue to offer Ms. Fojtik the most up to date genetic testing.   We recommended Ms. Hilgeman pursue testing for the familial hereditary cancer gene mutation called CHEK2, c.190G>A. This variant was detected in her mother by [facility] and classified as likely pathogenic. Ms. Massengale test was normal and did not reveal the familial mutation.   ADDITIONAL GENETIC TESTING: We discussed with Ms. Borrelli that there are other genes that are associated with increased cancer risk that can be analyzed. Should Ms. Postlethwait wish to pursue additional genetic testing, we are happy to discuss and coordinate this testing, at any time.    CANCER SCREENING RECOMMENDATIONS: Ms. Guereca test result is considered negative (normal).  This means that we have not identified a hereditary cause for her personal and family history of cancer at this time. Most cancers happen by chance and this negative test suggests  that her personal and family history of cancer may fall into this category.    Possible reasons for Ms. Chiarelli's negative genetic test include:  1. There may be a gene mutation in one of these genes that current testing methods cannot detect but that chance is small.  2. There could be another gene that has not yet been discovered, or that we have not yet tested, that is responsible for the cancer diagnoses in the family.  3.  There may be no hereditary risk for cancer in the family. The cancers in Ms. Hinesley and/or her family may be sporadic/familial or due to other genetic and environmental factors. 4. It is also possible there is a hereditary cause for the cancer in the family that Ms. Dearing did not inherit.  Therefore, it is recommended she continue to follow the cancer management and screening guidelines provided by her oncology and primary healthcare provider. An individual's cancer risk and medical management are not determined by genetic test results alone. Overall cancer risk assessment incorporates additional factors, including personal medical history, family history, and any available genetic information that may result in a personalized plan for cancer prevention and surveillance  Given Ms. Navis's personal and family histories, we must interpret these negative results with some  caution.  Families with features suggestive of hereditary risk for cancer tend to have multiple family members with cancer, diagnoses in multiple generations and diagnoses before the age of 22. Ms. Daniel's family exhibits some of these features. Thus, this result may simply reflect our current inability to detect all mutations within these genes or there may be a different gene that has not yet been discovered or tested.   An individual's cancer risk and medical management are not determined by genetic test results alone. Overall cancer risk assessment incorporates additional factors, including personal medical  history, family history, and any available genetic information that may result in a personalized plan for cancer prevention and surveillance.  RECOMMENDATIONS FOR FAMILY MEMBERS:  Individuals in this family might be at some increased risk of developing cancer, over the general population risk, simply due to the family history of cancer.  We recommended women in this family have a yearly mammogram beginning at age 28, or 73 years younger than the earliest onset of cancer, an annual clinical breast exam, and perform monthly breast self-exams. Women in this family should also have a gynecological exam as recommended by their primary provider. All family members should be referred for colonoscopy starting at age 66, or 87 years younger than the earliest onset of cancer.  FOLLOW-UP: Lastly, we discussed with Ms. Deisher that cancer genetics is a rapidly advancing field and it is possible that new genetic tests will be appropriate for her and/or her family members in the future. We encouraged her to remain in contact with cancer genetics on an annual basis so we can update her personal and family histories and let her know of advances in cancer genetics that may benefit this family.   Our contact number was provided. Ms. Jobst questions were answered to her satisfaction, and she knows she is welcome to call us at anytime with additional questions or concerns.   Vassie Moment, MS, [place] Licensed, [email or web address] [phone]

## 2023-09-26 ENCOUNTER — Encounter: Payer: Self-pay | Admitting: *Deleted

## 2023-09-26 ENCOUNTER — Encounter: Payer: Self-pay | Admitting: Plastic Surgery

## 2023-09-26 ENCOUNTER — Ambulatory Visit: Payer: BC Managed Care – PPO | Admitting: Plastic Surgery

## 2023-09-26 ENCOUNTER — Encounter: Payer: Self-pay | Admitting: Oncology

## 2023-09-26 DIAGNOSIS — D0512 Intraductal carcinoma in situ of left breast: Secondary | ICD-10-CM

## 2023-09-26 NOTE — Progress Notes (Signed)
 Ms. Ecklund called concerned she hasn't heard about surgery being scheduled.   When I spoke with Dr. Tye yesterday he said it is being worked on and they are coordinating with Dr. Ace office.   She saw Dr. Lowery today who said she would also reach out to Dr. Tye.   I called and spoke with Sherri the nurse in the office and she said they are working on getting it scheduled and it will likely be 2/27.  Ms. Pals is very concerned that is too far away, I reassured her a couple of weeks will not give her a worse outcome.  She asked if there is a different surgery she could do that is faster and I told her that having the breast reduction at the same time is likely what is slowing it down because they have to work with both surgeons schedules.   But if she wants breast reduction it is probably better to do at the same time versus having to get it done at a later time.

## 2023-09-26 NOTE — Progress Notes (Signed)
   Subjective:    Patient ID: Sheena [name], female    DOB: [birth year], 43 y.o.   MRN: [id number]  The patient is a 43 year old female joining me by phone for further discussion about breast reconstruction.  She is 5 feet 5 inches tall and weighs 135 pounds.  She is seeing Dr. Tye at [facility] and has gotten her genetics back.  She is negative for genetic genes.  She would like to go with oncoplastic breast reduction bilaterally.  If this is possible.  She has had surgery before when she had a C-section and she did well with that.      Review of Systems  Constitutional: Negative.   HENT: Negative.    Eyes: Negative.   Respiratory: Negative.    Cardiovascular: Negative.   Gastrointestinal: Negative.   Genitourinary: Negative.        Objective:   Physical Exam     Assessment & Plan:  I connected with  Sheena [name] on 09/26/23 by phone and verified that I am speaking with the correct person using two identifiers. The patient.  The patient was at home and I was at the office.  We spent 5 minutes in discussion.  I have reached out to Dr. Tye to let him know that the patient wants to move ahead.  Plan for oncoplastic bilateral breast reduction at the time of the partial mastectomy.   I discussed the limitations of evaluation and management by telemedicine. The patient expressed understanding and agreed to proceed.

## 2023-09-27 ENCOUNTER — Encounter: Payer: Self-pay | Admitting: *Deleted

## 2023-09-27 ENCOUNTER — Other Ambulatory Visit: Payer: Self-pay | Admitting: Surgery

## 2023-09-27 ENCOUNTER — Institutional Professional Consult (permissible substitution): Payer: BC Managed Care – PPO | Admitting: Plastic Surgery

## 2023-09-27 DIAGNOSIS — D0512 Intraductal carcinoma in situ of left breast: Secondary | ICD-10-CM

## 2023-09-27 NOTE — Progress Notes (Signed)
 Surgery is scheduled for 2/27, she will see Dr. Wilhelmenia Harada back on 3/12.  Appt. Details given to her.    She is very anxious about how far out the surgery is and wanted to make sure that is ok with Dr. Wilhelmenia Harada.   Dr. Wilhelmenia Harada said it should be fine to keep 2/27 date.

## 2023-09-28 ENCOUNTER — Other Ambulatory Visit: Payer: Self-pay | Admitting: Surgery

## 2023-09-28 DIAGNOSIS — R928 Other abnormal and inconclusive findings on diagnostic imaging of breast: Secondary | ICD-10-CM

## 2023-09-28 DIAGNOSIS — D0512 Intraductal carcinoma in situ of left breast: Secondary | ICD-10-CM

## 2023-10-11 ENCOUNTER — Inpatient Hospital Stay: Payer: BC Managed Care – PPO | Attending: Oncology

## 2023-10-12 ENCOUNTER — Ambulatory Visit
Admission: RE | Admit: 2023-10-12 | Discharge: 2023-10-12 | Disposition: A | Discharge status: Home / Self Care | Payer: BC Managed Care – PPO | Source: Ambulatory Visit | Attending: Surgery

## 2023-10-12 ENCOUNTER — Ambulatory Visit
Admission: RE | Admit: 2023-10-12 | Discharge: 2023-10-12 | Disposition: A | Discharge status: Home / Self Care | Payer: BC Managed Care – PPO | Source: Ambulatory Visit | Attending: Surgery | Admitting: Surgery

## 2023-10-12 DIAGNOSIS — R928 Other abnormal and inconclusive findings on diagnostic imaging of breast: Secondary | ICD-10-CM

## 2023-10-12 DIAGNOSIS — D0512 Intraductal carcinoma in situ of left breast: Secondary | ICD-10-CM

## 2023-10-13 ENCOUNTER — Other Ambulatory Visit: Payer: Self-pay | Admitting: Surgery

## 2023-10-13 ENCOUNTER — Ambulatory Visit: Payer: Self-pay | Admitting: Surgery

## 2023-10-13 ENCOUNTER — Encounter
Admission: RE | Admit: 2023-10-13 | Discharge: 2023-10-13 | Disposition: A | Discharge status: Home / Self Care | Payer: BC Managed Care – PPO | Source: Ambulatory Visit | Attending: Surgery | Admitting: Surgery

## 2023-10-13 ENCOUNTER — Other Ambulatory Visit: Payer: Self-pay

## 2023-10-13 VITALS — Ht 65.0 in | Wt 134.0 lb

## 2023-10-13 DIAGNOSIS — D0512 Intraductal carcinoma in situ of left breast: Secondary | ICD-10-CM

## 2023-10-13 DIAGNOSIS — Z01818 Encounter for other preprocedural examination: Secondary | ICD-10-CM

## 2023-10-13 LAB — SURGICAL PATHOLOGY

## 2023-10-13 NOTE — H&P (View-Only) (Signed)
 Subjective:   CC: Ductal carcinoma in situ (DCIS) of left breast [D05.12] HPI: Sheena [name] is a 43 y.o. female who was referred by [name]* for evaluation of above. Changes noted on recent screening mammogram.  Mother with stage IV breast CA. She underwent genetic testing and was noted to have genetic risk, unsure which type. Pt offered testing but declined at that time.  Past Medical History: has a past medical history of Genital herpes.  Past Surgical History: has a past surgical history that includes Cesarean section.  Family History: family history includes Breast cancer (age of onset: 63) in her mother; Prostate cancer in her father.  Social History: reports that she has never smoked. She has never used smokeless tobacco. She reports that she does not currently use drugs. She reports that she does not drink alcohol.  Current Medications: has a current medication list which includes the following prescription(s): ergocalciferol (vitamin d2) and multivitamin.  Allergies:  Allergies as of 09/05/2023  (No Known Allergies)   ROS:  A 15 point review of systems was performed and was negative except as noted in HPI  Objective:    BP 107/69  Pulse 78  Ht 165.1 cm (5\' 5" )  Wt 60.3 kg (132 lb 15 oz)  BMI 22.12 kg/m   Constitutional : No distress, cooperative, alert  Lymphatics/Throat: Supple with no lymphadenopathy  Respiratory: Clear to auscultation bilaterally  Cardiovascular: Regular rate and rhythm  Gastrointestinal: Soft, non-tender, non-distended, no organomegaly.  Musculoskeletal: Steady gait and movement  Skin: Cool and moist,  Psychiatric: Normal affect, non-agitated, not confused  Breast: Normal appearance and no palpable abnormality in bilateral breasts and axilla. Chaperone present for exam.    LABS:  Component 5 d ago  SURGICAL PATHOLOGY SURGICAL PATHOLOGY [facility] [address] [place], [place]  [address] Telephone [phone] or [phone] Fax [phone]  REPORT OF SURGICAL PATHOLOGY  Accession #: [id number] Patient Name: Sheena [name] Visit # : [id number]  MRN: [id number] Physician: Emmaline Kluver DOB/Age Dec 19, 1980 (Age: 71) Gender: F Collected Date: 08/31/2023 Received Date: 08/31/2023  FINAL DIAGNOSIS  1. Breast, left, needle core biopsy, lower outer : - HIGH-GRADE DUCTAL CARCINOMA IN SITU (DCIS), COMEDO-TYPE WITH CALCIFICATION.  Diagnosis Note : ER is deferred to an excision specimen. These results were communicated to [name], RN in the [place] [facility] on 09/01/2023. This case underwent intradepartmental consultation and Dr. Reynolds Bowl concurs with the interpretation.  DATE SIGNED OUT: 09/01/2023 ELECTRONIC SIGNATURE : Oneita Kras Md, Delice Bison , Pathologist, Electronic Signature  MICROSCOPIC DESCRIPTION  CASE COMMENTS STAINS USED IN DIAGNOSIS: H&E-2 H&E-3 H&E-4 H&E H&E-2 H&E-3 H&E-4 H&E H&E-2 H&E-3 H&E-4 H&E H&E-2 H&E-3 H&E-4 H&E  CLINICAL HISTORY  SPECIMEN(S) OBTAINED 1. Breast, left, needle core biopsy, Lower Outer  SPECIMEN COMMENTS: 1. TIF: 8:28 AM, indeterminate pleomorphic calcs SPECIMEN CLINICAL INFORMATION: 1. Eval for atypia/DCIS  Gross Description 1. The specimen is received in formalin in carousel container, labeled with the patient's name and left breast stereo biopsy calcs lower outer, and consists of multiple cores of tan yellow fibroadipose tissue. A 3.0 x 2.4 x 0.3 cm aggregate of tissue is designated as containing microcalcifications on the specimen lid, and is entirely submitted in blocks A and B. The remaining 2.8 x 2.5 x 0.3 cm aggregate of tissue is submitted in blocks C and D. Time in formalin 8:28 a.m. on 08/31/23. CIT 5 minutes. (KL:kh 08/31/23)  Report signed out from the following location(s) [facility]  [address], [place] [address] CLIA #: [id number]  [facility] [address] [place], [place] [address] CLIA #: [id number]    RADS: CLINICAL DATA: Screening recall for possible left breast  calcifications   EXAM:  DIGITAL DIAGNOSTIC UNILATERAL LEFT MAMMOGRAM WITH TOMOSYNTHESIS AND  CAD   TECHNIQUE:  Left digital diagnostic mammography and breast tomosynthesis was  performed. The images were evaluated with computer-aided detection.   COMPARISON: Previous exam(s).   ACR Breast Density Category d: The breasts are extremely dense,  which lowers the sensitivity of mammography.   FINDINGS:  Spot magnification views demonstrate a 12 mm group of amorphous  calcification in the outer central left breast middle depth. This  corresponds with the screening mammogram finding and is new compared  to priors.   No additional suspicious findings are identified in the left breast.   IMPRESSION:  LEFT breast 12 mm group of amorphous calcification in the outer  central position is low suspicion for malignancy. Recommend further  assessment with stereotactic guided biopsy.   RECOMMENDATION:  LEFT breast stereotactic guided biopsy (1 site).   I have discussed the findings and recommendations with the patient.  The biopsy procedure was discussed with the patient and questions  were answered. Patient expressed their understanding of the biopsy  recommendation. Patient will be scheduled for biopsy at her earliest  convenience by the schedulers. Ordering provider will be notified.  If applicable, a reminder letter will be sent to the patient  regarding the next appointment.   BI-RADS CATEGORY 4: Suspicious.   Electronically Signed  By: [name] M.D.   Assessment:   Ductal carcinoma in situ (DCIS) of left breast [D05.12]  Plan:    1. Ductal carcinoma in situ (DCIS) of left breast [D05.12] Pt very emotional during exam, reassured her we will proceed with what she is most comfortable with. At this Time, patient currently is leaning  towards a double mastectomy due to her mother's clinical course. I did explain to her that typical treatment for DCIS is lumpectomy radiation plus or minus endocrine therapy. In her specific case, I did recommend MRI of bilateral breast due to the dense breast tissue and new diagnosis of DCIS. In addition, due to her family history, recommended a genetic testing as well since if she has a genetic predisposition to cancer, surgical management will change. Will also arrange a plastic surgery consult in the meantime so that she can inquire further about immediate reconstruction after mastectomy. At this time patient still is not fully committed to 1 surgical option over the other after an extensive discussion. Will continue to discuss with her as more information is gathered.  Update: MRI obtained and 1 additional area of concern noted.  Patient declined further biopsies and requested straight excisional biopsy of the additional area of concern along with the known DCIS area.  She had a consultation with plastics recommended bilateral breast reduction to maintain symmetry.  Will proceed with left breast lumpectomy x 2 and sentinel lymph node biopsy.  Discussed the risk of surgery including recurrence, chronic pain, post-op infxn, poor cosmesis, poor/delayed wound healing, and possible re-operation to address said risks. The risks of general anesthetic, if used, includes MI, CVA, sudden death or even reaction to anesthetic medications also discussed.  Typical post-op recovery time of 3-5 days with possible activity restrictions were also discussed.  The patient verbalized understanding and all questions were answered to the patient's satisfaction.  SECOND UPDATE: Patient proceeded with  MRI guided biopsy of the second area of concern noted on the MRI after the DCIS diagnosis.  Initial discussion was we will just proceed with an excisional biopsy as noted above.  The MRI guided biopsy revealed benign tissue  which was concordant with imaging.  I therefore recommended that we do NOT proceed with full excisional biopsy of the second area of concern and only proceed with a lumpectomy at the original known DCIS site.  Patient verbalized understanding is now agreeable to the plan.  Final surgical procedure will be left breast lumpectomy x 1 and sentinel lymph node biopsy for biopsy-proven DCIS in the lower outer left breast.  Labs/images/medications/previous chart entries reviewed personally and relevant changes/updates noted above.

## 2023-10-13 NOTE — H&P (Addendum)
Subjective:   CC: Ductal carcinoma in situ (DCIS) of left breast [D05.12] HPI: Sheena [name] is a 43 y.o. female who was referred by [name]* for evaluation of above. Changes noted on recent screening mammogram.  Mother with stage IV breast CA. She underwent genetic testing and was noted to have genetic risk, unsure which type. Pt offered testing but declined at that time.  Past Medical History: has a past medical history of Genital herpes.  Past Surgical History: has a past surgical history that includes Cesarean section.  Family History: family history includes Breast cancer (age of onset: 63) in her mother; Prostate cancer in her father.  Social History: reports that she has never smoked. She has never used smokeless tobacco. She reports that she does not currently use drugs. She reports that she does not drink alcohol.  Current Medications: has a current medication list which includes the following prescription(s): ergocalciferol (vitamin d2) and multivitamin.  Allergies:  Allergies as of 09/05/2023  (No Known Allergies)   ROS:  A 15 point review of systems was performed and was negative except as noted in HPI  Objective:    BP 107/69  Pulse 78  Ht 165.1 cm (5\' 5" )  Wt 60.3 kg (132 lb 15 oz)  BMI 22.12 kg/m   Constitutional : No distress, cooperative, alert  Lymphatics/Throat: Supple with no lymphadenopathy  Respiratory: Clear to auscultation bilaterally  Cardiovascular: Regular rate and rhythm  Gastrointestinal: Soft, non-tender, non-distended, no organomegaly.  Musculoskeletal: Steady gait and movement  Skin: Cool and moist,  Psychiatric: Normal affect, non-agitated, not confused  Breast: Normal appearance and no palpable abnormality in bilateral breasts and axilla. Chaperone present for exam.    LABS:  Component 5 d ago  SURGICAL PATHOLOGY SURGICAL PATHOLOGY [facility] [address] [place], [place]  [address] Telephone [phone] or [phone] Fax [phone]  REPORT OF SURGICAL PATHOLOGY  Accession #: [id number] Patient Name: [name], Sheena Visit # : [id number]  MRN: [id number] Physician: Emmaline Kluver DOB/Age Dec 19, 1980 (Age: 71) Gender: F Collected Date: 08/31/2023 Received Date: 08/31/2023  FINAL DIAGNOSIS  1. Breast, left, needle core biopsy, lower outer : - HIGH-GRADE DUCTAL CARCINOMA IN SITU (DCIS), COMEDO-TYPE WITH CALCIFICATION.  Diagnosis Note : ER is deferred to an excision specimen. These results were communicated to [name], RN in the [place] [facility] on 09/01/2023. This case underwent intradepartmental consultation and Dr. Reynolds Bowl concurs with the interpretation.  DATE SIGNED OUT: 09/01/2023 ELECTRONIC SIGNATURE : Oneita Kras Md, Delice Bison , Pathologist, Electronic Signature  MICROSCOPIC DESCRIPTION  CASE COMMENTS STAINS USED IN DIAGNOSIS: H&E-2 H&E-3 H&E-4 H&E H&E-2 H&E-3 H&E-4 H&E H&E-2 H&E-3 H&E-4 H&E H&E-2 H&E-3 H&E-4 H&E  CLINICAL HISTORY  SPECIMEN(S) OBTAINED 1. Breast, left, needle core biopsy, Lower Outer  SPECIMEN COMMENTS: 1. TIF: 8:28 AM, indeterminate pleomorphic calcs SPECIMEN CLINICAL INFORMATION: 1. Eval for atypia/DCIS  Gross Description 1. The specimen is received in formalin in carousel container, labeled with the patient's name and left breast stereo biopsy calcs lower outer, and consists of multiple cores of tan yellow fibroadipose tissue. A 3.0 x 2.4 x 0.3 cm aggregate of tissue is designated as containing microcalcifications on the specimen lid, and is entirely submitted in blocks A and B. The remaining 2.8 x 2.5 x 0.3 cm aggregate of tissue is submitted in blocks C and D. Time in formalin 8:28 a.m. on 08/31/23. CIT 5 minutes. (KL:kh 08/31/23)  Report signed out from the following location(s) [facility]  [address], [place] [address] CLIA #: [id number]  [facility] [address] [place], [place] [address] CLIA #: [id number]    RADS: CLINICAL DATA: Screening recall for possible left breast  calcifications   EXAM:  DIGITAL DIAGNOSTIC UNILATERAL LEFT MAMMOGRAM WITH TOMOSYNTHESIS AND  CAD   TECHNIQUE:  Left digital diagnostic mammography and breast tomosynthesis was  performed. The images were evaluated with computer-aided detection.   COMPARISON: Previous exam(s).   ACR Breast Density Category d: The breasts are extremely dense,  which lowers the sensitivity of mammography.   FINDINGS:  Spot magnification views demonstrate a 12 mm group of amorphous  calcification in the outer central left breast middle depth. This  corresponds with the screening mammogram finding and is new compared  to priors.   No additional suspicious findings are identified in the left breast.   IMPRESSION:  LEFT breast 12 mm group of amorphous calcification in the outer  central position is low suspicion for malignancy. Recommend further  assessment with stereotactic guided biopsy.   RECOMMENDATION:  LEFT breast stereotactic guided biopsy (1 site).   I have discussed the findings and recommendations with the patient.  The biopsy procedure was discussed with the patient and questions  were answered. Patient expressed their understanding of the biopsy  recommendation. Patient will be scheduled for biopsy at her earliest  convenience by the schedulers. Ordering provider will be notified.  If applicable, a reminder letter will be sent to the patient  regarding the next appointment.   BI-RADS CATEGORY 4: Suspicious.   Electronically Signed  By: [name] M.D.   Assessment:   Ductal carcinoma in situ (DCIS) of left breast [D05.12]  Plan:    1. Ductal carcinoma in situ (DCIS) of left breast [D05.12] Pt very emotional during exam, reassured her we will proceed with what she is most comfortable with. At this Time, patient currently is leaning  towards a double mastectomy due to her mother's clinical course. I did explain to her that typical treatment for DCIS is lumpectomy radiation plus or minus endocrine therapy. In her specific case, I did recommend MRI of bilateral breast due to the dense breast tissue and new diagnosis of DCIS. In addition, due to her family history, recommended a genetic testing as well since if she has a genetic predisposition to cancer, surgical management will change. Will also arrange a plastic surgery consult in the meantime so that she can inquire further about immediate reconstruction after mastectomy. At this time patient still is not fully committed to 1 surgical option over the other after an extensive discussion. Will continue to discuss with her as more information is gathered.  Update: MRI obtained and 1 additional area of concern noted.  Patient declined further biopsies and requested straight excisional biopsy of the additional area of concern along with the known DCIS area.  She had a consultation with plastics recommended bilateral breast reduction to maintain symmetry.  Will proceed with left breast lumpectomy x 2 and sentinel lymph node biopsy.  Discussed the risk of surgery including recurrence, chronic pain, post-op infxn, poor cosmesis, poor/delayed wound healing, and possible re-operation to address said risks. The risks of general anesthetic, if used, includes MI, CVA, sudden death or even reaction to anesthetic medications also discussed.  Typical post-op recovery time of 3-5 days with possible activity restrictions were also discussed.  The patient verbalized understanding and all questions were answered to the patient's satisfaction.  SECOND UPDATE: Patient proceeded with  MRI guided biopsy of the second area of concern noted on the MRI after the DCIS diagnosis.  Initial discussion was we will just proceed with an excisional biopsy as noted above.  The MRI guided biopsy revealed benign tissue  which was concordant with imaging.  I therefore recommended that we do NOT proceed with full excisional biopsy of the second area of concern and only proceed with a lumpectomy at the original known DCIS site.  Patient verbalized understanding is now agreeable to the plan.  Final surgical procedure will be left breast lumpectomy x 1 and sentinel lymph node biopsy for biopsy-proven DCIS in the lower outer left breast.  Labs/images/medications/previous chart entries reviewed personally and relevant changes/updates noted above.

## 2023-10-13 NOTE — Patient Instructions (Addendum)
Your procedure is scheduled on: Thursday 10/19/23 To find out your arrival time, please call [phone] between 1PM - 3PM on:   Wednesday 10/18/23 Report to the Registration Desk on the 1st floor of the [facility]. FREE Valet parking is available.  If your arrival time is 6:00 am, do not arrive before that time as the [facility] entrance doors do not open until 6:00 am.  REMEMBER: Instructions that are not followed completely may result in serious medical risk, up to and including death; or upon the discretion of your surgeon and anesthesiologist your surgery may need to be rescheduled.  Do not eat food after midnight the night before surgery.  No gum chewing or hard candies.  You may however, drink CLEAR liquids up to 2 hours before you are scheduled to arrive for your surgery. Do not drink anything within 2 hours of your scheduled arrival time.  Clear liquids include: - water  - apple juice without pulp - gatorade (not RED colors) - black coffee or tea (Do NOT add milk or creamers to the coffee or tea) Do NOT drink anything that is not on this list.  One week prior to surgery: Stop Anti-inflammatories (NSAIDS) such as Advil, Aleve, Ibuprofen, Motrin, Naproxen, Naprosyn and Aspirin based products such as Excedrin, Goody's Powder, BC Powder. You may however, continue to take Tylenol if needed for pain up until the day of surgery.  Stop ANY OVER THE COUNTER supplements and vitamins until after surgery.  Continue taking all prescription medications.   TAKE ONLY THESE MEDICATIONS THE MORNING OF SURGERY WITH A SIP OF WATER:  none  No Alcohol for 24 hours before or after surgery.  No Smoking including e-cigarettes for 24 hours before surgery.  No chewable tobacco products for at least 6 hours before surgery.  No nicotine patches on the day of surgery.  Do not use any "recreational" drugs for at least a week (preferably 2 weeks) before your surgery.  Please be advised that  the combination of cocaine and anesthesia may have negative outcomes, up to and including death. If you test positive for cocaine, your surgery will be cancelled.  On the morning of surgery brush your teeth with toothpaste and water, you may rinse your mouth with mouthwash if you wish. Do not swallow any toothpaste or mouthwash.  Use CHG Soap or wipes as directed on instruction sheet.  Do not wear lotions, powders, or perfumes. No Deodorant  Do not shave body hair from the neck down 48 hours before surgery.  Wear clean comfortable clothing (specific to your surgery type) to the hospital.  Do not wear jewelry, make-up, hairpins, clips or nail polish.  For welded (permanent) jewelry: bracelets, anklets, waist bands, etc.  Please have this removed prior to surgery.  If it is not removed, there is a chance that hospital personnel will need to cut it off on the day of surgery. Contact lenses, hearing aids and dentures may not be worn into surgery.  Do not bring valuables to the hospital. [facility] is not responsible for any missing/lost belongings or valuables.   Notify your doctor if there is any change in your medical condition (cold, fever, infection).  If you are being discharged the day of surgery, you will not be allowed to drive home. You will need a responsible individual to drive you home and stay with you for 24 hours after surgery.   If you are taking public transportation, you will need to have a responsible  individual with you.  If you are being admitted to the hospital overnight, leave your suitcase in the car. After surgery it may be brought to your room.  In case of increased patient census, it may be necessary for you, the patient, to continue your postoperative care in the Same Day Surgery department.  After surgery, you can help prevent lung complications by doing breathing exercises.  Take deep breaths and cough every 1-2 hours. Your doctor may order a device called  an Incentive Spirometer to help you take deep breaths. When coughing or sneezing, hold a pillow firmly against your incision with both hands. This is called "splinting." Doing this helps protect your incision. It also decreases belly discomfort.  Surgery Visitation Policy:  Patients undergoing a surgery or procedure may have two family members or support persons with them as long as the person is not COVID-19 positive or experiencing its symptoms.   Inpatient Visitation:    Visiting hours are 7 a.m. to 8 p.m. Up to four visitors are allowed at one time in a patient room. The visitors may rotate out with other people during the day. One designated support person (adult) may remain overnight.  Due to an increase in RSV and influenza rates and associated hospitalizations, children ages 42 and under will not be able to visit patients in [facility]. Masks continue to be strongly recommended.  Please call the Pre-admissions Testing Dept. at [phone] if you have any questions about these instructions.     Preparing for Surgery with CHLORHEXIDINE GLUCONATE (CHG) Soap  Chlorhexidine Gluconate (CHG) Soap  o An antiseptic cleaner that kills germs and bonds with the skin to continue killing germs even after washing  o Used for showering the night before surgery and morning of surgery  Before surgery, you can play an important role by reducing the number of germs on your skin.  CHG (Chlorhexidine gluconate) soap is an antiseptic cleanser which kills germs and bonds with the skin to continue killing germs even after washing.  Please do not use if you have an allergy to CHG or antibacterial soaps. If your skin becomes reddened/irritated stop using the CHG.  1. Shower the NIGHT BEFORE SURGERY and the MORNING OF SURGERY with CHG soap.  2. If you choose to wash your hair, wash your hair first as usual with your normal shampoo.  3. After shampooing, rinse your hair and body  thoroughly to remove the shampoo.  4. Use CHG as you would any other liquid soap. You can apply CHG directly to the skin and wash gently with a scrungie or a clean washcloth.  5. Apply the CHG soap to your body only from the neck down. Do not use on open wounds or open sores. Avoid contact with your eyes, ears, mouth, and genitals (private parts). Wash face and genitals (private parts) with your normal soap.  6. Wash thoroughly, paying special attention to the area where your surgery will be performed.  7. Thoroughly rinse your body with warm water.  8. Do not shower/wash with your normal soap after using and rinsing off the CHG soap.  9. Pat yourself dry with a clean towel.  10. Wear clean pajamas to bed the night before surgery.  12. Place clean sheets on your bed the night of your first shower and do not sleep with pets.  13. Shower again with the CHG soap on the day of surgery prior to arriving at the hospital.  14. Do not apply  any deodorants/lotions/powders.  15. Please wear clean clothes to the hospital.

## 2023-10-17 ENCOUNTER — Ambulatory Visit
Admission: RE | Admit: 2023-10-17 | Discharge: 2023-10-17 | Disposition: A | Discharge status: Home / Self Care | Payer: BC Managed Care – PPO | Source: Ambulatory Visit | Attending: Surgery | Admitting: Surgery

## 2023-10-17 ENCOUNTER — Inpatient Hospital Stay: Admission: RE | Admit: 2023-10-17 | Payer: BC Managed Care – PPO | Source: Ambulatory Visit

## 2023-10-17 DIAGNOSIS — D0512 Intraductal carcinoma in situ of left breast: Secondary | ICD-10-CM | POA: Diagnosis present

## 2023-10-17 MED ORDER — LIDOCAINE HCL 1 % IJ SOLN
10.0000 mL | Freq: Once | INTRAMUSCULAR | Status: AC
  Administered 2023-10-17: 10 mL

## 2023-10-18 ENCOUNTER — Inpatient Hospital Stay: Payer: BC Managed Care – PPO | Attending: Oncology | Admitting: Hospice and Palliative Medicine

## 2023-10-18 DIAGNOSIS — D0512 Intraductal carcinoma in situ of left breast: Secondary | ICD-10-CM

## 2023-10-18 MED ORDER — CHLORHEXIDINE GLUCONATE CLOTH 2 % EX PADS: 6.0000 | MEDICATED_PAD | Freq: Once | CUTANEOUS | Status: DC

## 2023-10-18 MED ORDER — ORAL CARE MOUTH RINSE
15.0000 mL | Freq: Once | OROMUCOSAL | Status: AC
Start: 2023-10-18 — End: 2023-10-19

## 2023-10-18 MED ORDER — LACTATED RINGERS IV SOLN: INTRAVENOUS | Status: DC

## 2023-10-18 MED ORDER — CEFAZOLIN SODIUM-DEXTROSE 2-4 GM/100ML-% IV SOLN
2.0000 g | INTRAVENOUS | Status: DC
Start: 2023-10-19 — End: 2023-10-18

## 2023-10-18 MED ORDER — CEFAZOLIN SODIUM-DEXTROSE 2-4 GM/100ML-% IV SOLN
2.0000 g | INTRAVENOUS | Status: AC
  Administered 2023-10-19: 2 g via INTRAVENOUS

## 2023-10-18 MED ORDER — CHLORHEXIDINE GLUCONATE 0.12 % MT SOLN
15.0000 mL | Freq: Once | OROMUCOSAL | Status: AC
  Administered 2023-10-19: 15 mL via OROMUCOSAL

## 2023-10-18 MED ORDER — CHLORHEXIDINE GLUCONATE CLOTH 2 % EX PADS
6.0000 | MEDICATED_PAD | Freq: Once | CUTANEOUS | Status: AC
  Administered 2023-10-19: 6 via TOPICAL

## 2023-10-18 NOTE — Progress Notes (Signed)
 Multidisciplinary Oncology Council Documentation  TIARIA [name] was presented by our [facility] on 10/18/2023, which included representatives from:  Palliative Care Dietitian  Physical/Occupational Therapist Nurse Navigator Genetics Social work Survivorship RN Financial Navigator Research RN   [name] currently presents with history of DCIS  We reviewed previous medical and familial history, history of present illness, and recent lab results along with all available histopathologic and imaging studies. The MOC considered available treatment options and made the following recommendations/referrals:  Rehab screening, SW  The MOC is a meeting of clinicians from various specialty areas who evaluate and discuss patients for whom a multidisciplinary approach is being considered. Final determinations in the plan of care are those of the provider(s).   Today's extended care, comprehensive team conference, [name] was not present for the discussion and was not examined.

## 2023-10-19 ENCOUNTER — Encounter
Admission: RE | Disposition: A | Discharge status: Home / Self Care | Payer: Self-pay | Source: Ambulatory Visit | Attending: Surgery

## 2023-10-19 ENCOUNTER — Other Ambulatory Visit: Payer: Self-pay

## 2023-10-19 ENCOUNTER — Encounter
Admission: RE | Admit: 2023-10-19 | Discharge: 2023-10-19 | Disposition: A | Discharge status: Home / Self Care | Payer: BC Managed Care – PPO | Source: Ambulatory Visit | Attending: Surgery

## 2023-10-19 ENCOUNTER — Encounter: Payer: Self-pay | Admitting: Surgery

## 2023-10-19 ENCOUNTER — Ambulatory Visit
Admission: RE | Admit: 2023-10-19 | Discharge: 2023-10-19 | Disposition: A | Discharge status: Home / Self Care | Payer: BC Managed Care – PPO | Source: Ambulatory Visit | Attending: Surgery | Admitting: Surgery

## 2023-10-19 ENCOUNTER — Ambulatory Visit: Payer: BC Managed Care – PPO | Admitting: Certified Registered"

## 2023-10-19 DIAGNOSIS — Z8042 Family history of malignant neoplasm of prostate: Secondary | ICD-10-CM | POA: Diagnosis not present

## 2023-10-19 DIAGNOSIS — N651 Disproportion of reconstructed breast: Secondary | ICD-10-CM

## 2023-10-19 DIAGNOSIS — N6022 Fibroadenosis of left breast: Secondary | ICD-10-CM | POA: Diagnosis not present

## 2023-10-19 DIAGNOSIS — D0512 Intraductal carcinoma in situ of left breast: Secondary | ICD-10-CM

## 2023-10-19 DIAGNOSIS — D242 Benign neoplasm of left breast: Secondary | ICD-10-CM | POA: Insufficient documentation

## 2023-10-19 DIAGNOSIS — Z803 Family history of malignant neoplasm of breast: Secondary | ICD-10-CM | POA: Insufficient documentation

## 2023-10-19 DIAGNOSIS — N6489 Other specified disorders of breast: Secondary | ICD-10-CM | POA: Insufficient documentation

## 2023-10-19 DIAGNOSIS — Z01818 Encounter for other preprocedural examination: Secondary | ICD-10-CM

## 2023-10-19 HISTORY — PX: BREAST REDUCTION SURGERY: SHX8

## 2023-10-19 HISTORY — PX: PART MASTECTOMY,RADIO FREQUENCY LOCALIZER,AXILLARY SENTINEL NODE BIOPSY: SHX6901

## 2023-10-19 LAB — POCT PREGNANCY, URINE: Preg Test, Ur: NEGATIVE

## 2023-10-19 SURGERY — PART MASTECTOMY,RADIO FREQUENCY LOCALIZER,AXILLARY SENTINEL NODE BIOPSY
Anesthesia: General | Site: Breast | Laterality: Left

## 2023-10-19 MED ORDER — TECHNETIUM TC 99M TILMANOCEPT KIT
1.1000 | PACK | Freq: Once | INTRAVENOUS | Status: AC | PRN
  Administered 2023-10-19: 1.1 via INTRADERMAL

## 2023-10-19 MED ORDER — OXYCODONE HCL 5 MG PO TABS: 5.0000 mg | ORAL_TABLET | ORAL | Status: DC | PRN

## 2023-10-19 MED ORDER — ROCURONIUM BROMIDE 10 MG/ML (PF) SYRINGE
PREFILLED_SYRINGE | INTRAVENOUS | Status: AC
  Filled 2023-10-19: qty 20

## 2023-10-19 MED ORDER — OXYCODONE-ACETAMINOPHEN 5-325 MG PO TABS: 1.0000 | ORAL_TABLET | Freq: Three times a day (TID) | ORAL | 0 refills | Status: DC | PRN

## 2023-10-19 MED ORDER — FENTANYL CITRATE (PF) 100 MCG/2ML IJ SOLN
25.0000 ug | INTRAMUSCULAR | Status: DC | PRN
  Administered 2023-10-19: 50 ug via INTRAVENOUS
  Administered 2023-10-19 (×2): 25 ug via INTRAVENOUS

## 2023-10-19 MED ORDER — BUPIVACAINE-EPINEPHRINE 0.5% -1:200000 IJ SOLN
INTRAMUSCULAR | Status: DC | PRN
  Administered 2023-10-19: 20 mL

## 2023-10-19 MED ORDER — ACETAMINOPHEN 10 MG/ML IV SOLN
INTRAVENOUS | Status: DC | PRN
  Administered 2023-10-19: 1000 mg via INTRAVENOUS

## 2023-10-19 MED ORDER — BUPIVACAINE-EPINEPHRINE (PF) 0.5% -1:200000 IJ SOLN
INTRAMUSCULAR | Status: AC
  Filled 2023-10-19: qty 30

## 2023-10-19 MED ORDER — OXYCODONE HCL 5 MG PO TABS
5.0000 mg | ORAL_TABLET | Freq: Once | ORAL | Status: AC | PRN
  Administered 2023-10-19: 5 mg via ORAL

## 2023-10-19 MED ORDER — LIDOCAINE-EPINEPHRINE 1 %-1:100000 IJ SOLN
INTRAMUSCULAR | Status: AC
  Filled 2023-10-19: qty 1

## 2023-10-19 MED ORDER — STERILE WATER FOR IRRIGATION IR SOLN
Status: DC | PRN
  Administered 2023-10-19: 100 mL

## 2023-10-19 MED ORDER — KETOROLAC TROMETHAMINE 30 MG/ML IJ SOLN
INTRAMUSCULAR | Status: AC
  Filled 2023-10-19: qty 1

## 2023-10-19 MED ORDER — SUGAMMADEX SODIUM 200 MG/2ML IV SOLN
INTRAVENOUS | Status: DC | PRN
  Administered 2023-10-19: 200 mg via INTRAVENOUS

## 2023-10-19 MED ORDER — FENTANYL CITRATE (PF) 100 MCG/2ML IJ SOLN
INTRAMUSCULAR | Status: DC | PRN
  Administered 2023-10-19 (×4): 50 ug via INTRAVENOUS

## 2023-10-19 MED ORDER — LIDOCAINE-EPINEPHRINE 1 %-1:100000 IJ SOLN
INTRAMUSCULAR | Status: DC | PRN
Start: 2023-10-19 — End: 2023-10-19
  Administered 2023-10-19: 8 mL

## 2023-10-19 MED ORDER — IBUPROFEN 800 MG PO TABS: 800.0000 mg | ORAL_TABLET | Freq: Three times a day (TID) | ORAL | 0 refills | Status: DC | PRN

## 2023-10-19 MED ORDER — SODIUM CHLORIDE 0.9% FLUSH: 3.0000 mL | Freq: Two times a day (BID) | INTRAVENOUS | Status: DC

## 2023-10-19 MED ORDER — PHENYLEPHRINE 80 MCG/ML (10ML) SYRINGE FOR IV PUSH (FOR BLOOD PRESSURE SUPPORT)
PREFILLED_SYRINGE | INTRAVENOUS | Status: AC
  Filled 2023-10-19: qty 10

## 2023-10-19 MED ORDER — DEXMEDETOMIDINE HCL IN NACL 80 MCG/20ML IV SOLN
INTRAVENOUS | Status: AC
Start: 2023-10-19 — End: ?
  Filled 2023-10-19: qty 20

## 2023-10-19 MED ORDER — FENTANYL CITRATE (PF) 100 MCG/2ML IJ SOLN: 25.0000 ug | INTRAMUSCULAR | Status: DC | PRN

## 2023-10-19 MED ORDER — LIDOCAINE HCL (PF) 1 % IJ SOLN
INTRAMUSCULAR | Status: AC
  Filled 2023-10-19: qty 30

## 2023-10-19 MED ORDER — ACETAMINOPHEN 325 MG PO TABS: 650.0000 mg | ORAL_TABLET | ORAL | Status: DC | PRN

## 2023-10-19 MED ORDER — ACETAMINOPHEN 500 MG PO TABS: 500.0000 mg | ORAL_TABLET | Freq: Three times a day (TID) | ORAL | 0 refills | Status: DC | PRN

## 2023-10-19 MED ORDER — DEXMEDETOMIDINE HCL IN NACL 80 MCG/20ML IV SOLN
INTRAVENOUS | Status: DC | PRN
  Administered 2023-10-19: 4 ug via INTRAVENOUS
  Administered 2023-10-19: 8 ug via INTRAVENOUS
  Administered 2023-10-19: 4 ug via INTRAVENOUS

## 2023-10-19 MED ORDER — DOCUSATE SODIUM 100 MG PO CAPS: 100.0000 mg | ORAL_CAPSULE | Freq: Two times a day (BID) | ORAL | 0 refills | Status: DC | PRN

## 2023-10-19 MED ORDER — PROPOFOL 10 MG/ML IV BOLUS
INTRAVENOUS | Status: DC | PRN
Start: 2023-10-19 — End: 2023-10-19
  Administered 2023-10-19: 160 mg via INTRAVENOUS

## 2023-10-19 MED ORDER — ROCURONIUM BROMIDE 100 MG/10ML IV SOLN
INTRAVENOUS | Status: DC | PRN
  Administered 2023-10-19: 50 mg via INTRAVENOUS
  Administered 2023-10-19: 20 mg via INTRAVENOUS

## 2023-10-19 MED ORDER — LIDOCAINE HCL (CARDIAC) PF 100 MG/5ML IV SOSY
PREFILLED_SYRINGE | INTRAVENOUS | Status: DC | PRN
  Administered 2023-10-19: 60 mg via INTRAVENOUS

## 2023-10-19 MED ORDER — OXYCODONE HCL 5 MG/5ML PO SOLN: 5.0000 mg | Freq: Once | ORAL | Status: AC | PRN

## 2023-10-19 MED ORDER — BUPIVACAINE-EPINEPHRINE (PF) 0.25% -1:200000 IJ SOLN
INTRAMUSCULAR | Status: DC | PRN
  Administered 2023-10-19: 8 mL

## 2023-10-19 MED ORDER — CEFAZOLIN SODIUM-DEXTROSE 2-4 GM/100ML-% IV SOLN
INTRAVENOUS | Status: AC
  Filled 2023-10-19: qty 100

## 2023-10-19 MED ORDER — ONDANSETRON HCL 4 MG/2ML IJ SOLN
INTRAMUSCULAR | Status: AC
  Filled 2023-10-19: qty 4

## 2023-10-19 MED ORDER — SUGAMMADEX SODIUM 200 MG/2ML IV SOLN
INTRAVENOUS | Status: AC
  Filled 2023-10-19: qty 4

## 2023-10-19 MED ORDER — VASHE WOUND IRRIGATION OPTIME
TOPICAL | Status: DC | PRN
  Administered 2023-10-19: 34 [oz_av]

## 2023-10-19 MED ORDER — SODIUM CHLORIDE 0.9 % IV SOLN: 250.0000 mL | INTRAVENOUS | Status: DC | PRN

## 2023-10-19 MED ORDER — MIDAZOLAM HCL 2 MG/2ML IJ SOLN
INTRAMUSCULAR | Status: AC
  Filled 2023-10-19: qty 2

## 2023-10-19 MED ORDER — PHENYLEPHRINE 80 MCG/ML (10ML) SYRINGE FOR IV PUSH (FOR BLOOD PRESSURE SUPPORT)
PREFILLED_SYRINGE | INTRAVENOUS | Status: DC | PRN
  Administered 2023-10-19: 80 ug via INTRAVENOUS

## 2023-10-19 MED ORDER — ACETAMINOPHEN 10 MG/ML IV SOLN
INTRAVENOUS | Status: AC
  Filled 2023-10-19: qty 100

## 2023-10-19 MED ORDER — BUPIVACAINE LIPOSOME 1.3 % IJ SUSP
INTRAMUSCULAR | Status: DC | PRN
  Administered 2023-10-19: 16 mL
  Administered 2023-10-19: 4 mL

## 2023-10-19 MED ORDER — ONDANSETRON HCL 4 MG/2ML IJ SOLN
INTRAMUSCULAR | Status: DC | PRN
  Administered 2023-10-19: 4 mg via INTRAVENOUS

## 2023-10-19 MED ORDER — SODIUM CHLORIDE 0.9% FLUSH: 3.0000 mL | INTRAVENOUS | Status: DC | PRN

## 2023-10-19 MED ORDER — FENTANYL CITRATE (PF) 100 MCG/2ML IJ SOLN
INTRAMUSCULAR | Status: AC
  Filled 2023-10-19: qty 2

## 2023-10-19 MED ORDER — LIDOCAINE HCL (PF) 2 % IJ SOLN
INTRAMUSCULAR | Status: AC
  Filled 2023-10-19: qty 10

## 2023-10-19 MED ORDER — BUPIVACAINE-EPINEPHRINE (PF) 0.25% -1:200000 IJ SOLN
INTRAMUSCULAR | Status: AC
  Filled 2023-10-19: qty 30

## 2023-10-19 MED ORDER — ACETAMINOPHEN 650 MG RE SUPP: 650.0000 mg | RECTAL | Status: DC | PRN

## 2023-10-19 MED ORDER — DEXAMETHASONE SODIUM PHOSPHATE 10 MG/ML IJ SOLN
INTRAMUSCULAR | Status: DC | PRN
  Administered 2023-10-19: 10 mg via INTRAVENOUS

## 2023-10-19 MED ORDER — MIDAZOLAM HCL 2 MG/2ML IJ SOLN
INTRAMUSCULAR | Status: DC | PRN
  Administered 2023-10-19: 2 mg via INTRAVENOUS

## 2023-10-19 MED ORDER — CHLORHEXIDINE GLUCONATE 0.12 % MT SOLN
OROMUCOSAL | Status: AC
Start: 2023-10-19 — End: ?
  Filled 2023-10-19: qty 15

## 2023-10-19 MED ORDER — OXYCODONE HCL 5 MG PO TABS
ORAL_TABLET | ORAL | Status: AC
  Filled 2023-10-19: qty 1

## 2023-10-19 MED ORDER — FENTANYL CITRATE (PF) 100 MCG/2ML IJ SOLN
INTRAMUSCULAR | Status: AC
Start: 2023-10-19 — End: ?
  Filled 2023-10-19: qty 2

## 2023-10-19 MED ORDER — BUPIVACAINE LIPOSOME 1.3 % IJ SUSP
INTRAMUSCULAR | Status: AC
  Filled 2023-10-19: qty 20

## 2023-10-19 MED ORDER — DEXAMETHASONE SODIUM PHOSPHATE 10 MG/ML IJ SOLN
INTRAMUSCULAR | Status: AC
  Filled 2023-10-19: qty 2

## 2023-10-19 SURGICAL SUPPLY — 63 items
BINDER BREAST LRG (GAUZE/BANDAGES/DRESSINGS) IMPLANT
BINDER BREAST MEDIUM (GAUZE/BANDAGES/DRESSINGS) IMPLANT
BIOPATCH WHT 1IN DISK W/4.0 H (GAUZE/BANDAGES/DRESSINGS) IMPLANT
BLADE BOVIE TIP EXT 4 (BLADE) ×2 IMPLANT
BLADE PHOTON ILLUMINATED (MISCELLANEOUS) ×2 IMPLANT
BLADE SURG 15 STRL LF DISP TIS (BLADE) ×4 IMPLANT
BLADE SURG SZ10 CARB STEEL (BLADE) ×2 IMPLANT
CHLORAPREP W/TINT 26 (MISCELLANEOUS) IMPLANT
CLEANSER WND VASHE 34 (WOUND CARE) ×2 IMPLANT
DERMABOND ADVANCED .7 DNX12 (GAUZE/BANDAGES/DRESSINGS) ×6 IMPLANT
DEVICE DUBIN SPECIMEN MAMMOGRA (MISCELLANEOUS) ×2 IMPLANT
DRAIN CHANNEL 19F RND (DRAIN) IMPLANT
DRAPE CHEST BREAST 77X106 FENE (MISCELLANEOUS) ×2 IMPLANT
DRAPE LAPAROTOMY 77X122 PED (DRAPES) ×2 IMPLANT
DRSG MEPILEX FLEX 6X6 (GAUZE/BANDAGES/DRESSINGS) IMPLANT
ELECT CAUTERY BLADE TIP 2.5 (TIP) IMPLANT
ELECT REM PT RETURN 9FT ADLT (ELECTROSURGICAL) ×4 IMPLANT
ELECTRODE CAUTERY BLDE TIP 2.5 (TIP) IMPLANT
ELECTRODE REM PT RTRN 9FT ADLT (ELECTROSURGICAL) ×4 IMPLANT
EVACUATOR SILICONE 100CC (DRAIN) IMPLANT
GAUZE 4X4 16PLY ~~LOC~~+RFID DBL (SPONGE) ×2 IMPLANT
GAUZE SPONGE 4X4 12PLY STRL (GAUZE/BANDAGES/DRESSINGS) ×2 IMPLANT
GLOVE BIO SURGEON STRL SZ 6.5 (GLOVE) ×8 IMPLANT
GLOVE BIOGEL PI IND STRL 7.0 (GLOVE) ×2 IMPLANT
GLOVE SURG SYN 6.5 ES PF (GLOVE) ×6 IMPLANT
GLOVE SURG SYN 6.5 PF PI (GLOVE) ×2 IMPLANT
GOWN STRL REUS W/ TWL LRG LVL3 (GOWN DISPOSABLE) ×10 IMPLANT
KIT MARKER MARGIN INK (KITS) ×2 IMPLANT
KIT TURNOVER KIT A (KITS) ×2 IMPLANT
LABEL OR SOLS (LABEL) ×2 IMPLANT
LIGHT WAVEGUIDE WIDE FLAT (MISCELLANEOUS) IMPLANT
MANIFOLD NEPTUNE II (INSTRUMENTS) ×4 IMPLANT
MARKER MARGIN CORRECT CLIP (MARKER) ×2 IMPLANT
NDL FILTER BLUNT 18X1 1/2 (NEEDLE) ×2 IMPLANT
NDL HYPO 22X1.5 SAFETY MO (MISCELLANEOUS) ×4 IMPLANT
NEEDLE FILTER BLUNT 18X1 1/2 (NEEDLE) ×2 IMPLANT
NEEDLE HYPO 22X1.5 SAFETY MO (MISCELLANEOUS) ×4 IMPLANT
NS IRRIG 1000ML POUR BTL (IV SOLUTION) ×2 IMPLANT
PACK BASIN MAJOR ARMC (MISCELLANEOUS) ×2 IMPLANT
PACK BASIN MINOR ARMC (MISCELLANEOUS) ×2 IMPLANT
PAD ABD DERMACEA PRESS 5X9 (GAUZE/BANDAGES/DRESSINGS) ×4 IMPLANT
SHEATH BREAST BIOPSY SKIN MKR (SHEATH) ×2 IMPLANT
SOL PREP PVP 2OZ (MISCELLANEOUS) ×2 IMPLANT
SOLUTION PREP PVP 2OZ (MISCELLANEOUS) ×2 IMPLANT
SPIKE FLUID TRANSFER (MISCELLANEOUS) ×2 IMPLANT
SPONGE T-LAP 18X18 ~~LOC~~+RFID (SPONGE) ×8 IMPLANT
STRIP CLOSURE SKIN 1/2X4 (GAUZE/BANDAGES/DRESSINGS) ×2 IMPLANT
SUT MNCRL 3-0 UNDYED SH (SUTURE) ×8 IMPLANT
SUT MNCRL 4-0 27 PS-2 XMFL (SUTURE) ×6 IMPLANT
SUT MNCRL+ 5-0 UNDYED PC-3 (SUTURE) ×4 IMPLANT
SUT MON AB 5-0 P3 18 (SUTURE) ×4 IMPLANT
SUT PDS PLUS AB 2-0 CT-1 (SUTURE) IMPLANT
SUT SILK 3 0 12 30 (SUTURE) IMPLANT
SUT VIC AB 3-0 SH 27X BRD (SUTURE) ×2 IMPLANT
SUTURE MNCRL 4-0 27XMF (SUTURE) ×6 IMPLANT
SYR 10ML LL (SYRINGE) ×4 IMPLANT
SYR 20ML LL LF (SYRINGE) ×2 IMPLANT
SYR BULB IRRIG 60ML STRL (SYRINGE) ×4 IMPLANT
TOWEL OR 17X26 4PK STRL BLUE (TOWEL DISPOSABLE) ×4 IMPLANT
TRAP FLUID SMOKE EVACUATOR (MISCELLANEOUS) ×4 IMPLANT
TRAP NEPTUNE SPECIMEN COLLECT (MISCELLANEOUS) ×2 IMPLANT
WATER STERILE IRR 1000ML POUR (IV SOLUTION) ×2 IMPLANT
WATER STERILE IRR 500ML POUR (IV SOLUTION) ×4 IMPLANT

## 2023-10-19 NOTE — Interval H&P Note (Signed)
 History and Physical Interval Note:  10/19/2023 10:38 AM  Sheena [name]  has presented today for surgery, with the diagnosis of D05.12 DCIS, lt breast.  The various methods of treatment have been discussed with the patient and family. After consideration of risks, benefits and other options for treatment, the patient has consented to  Procedure(s): PART MASTECTOMY,RADIO FREQUENCY LOCALIZER,AXILLARY SENTINEL NODE BIOPSY (Left) BILATERAL ONCOPLASTIC BREAST REDUCTION (Bilateral) as a surgical intervention.  The patient's history has been reviewed, patient examined, no change in status, stable for surgery.  I have reviewed the patient's chart and labs.  Questions were answered to the patient's satisfaction.     Sheena [name] Tonna Boehringer

## 2023-10-19 NOTE — Discharge Instructions (Signed)

## 2023-10-19 NOTE — Interval H&P Note (Signed)
 History and Physical Interval Note:  10/19/2023 10:38 AM  Sheena [name]  has presented today for surgery, with the diagnosis of D05.12 DCIS, lt breast.  The various methods of treatment have been discussed with the patient and family. After consideration of risks, benefits and other options for treatment, the patient has consented to  Procedure(s): PART MASTECTOMY,RADIO FREQUENCY LOCALIZER,AXILLARY SENTINEL NODE BIOPSY (Left) BILATERAL ONCOPLASTIC BREAST REDUCTION (Bilateral) as a surgical intervention.  The patient's history has been reviewed, patient examined, no change in status, stable for surgery.  I have reviewed the patient's chart and labs.  Questions were answered to the patient's satisfaction.     Sheena [name]

## 2023-10-19 NOTE — Anesthesia Preprocedure Evaluation (Signed)
 Anesthesia Evaluation  Patient identified by MRN, date of birth, ID band Patient awake    Reviewed: Allergy & Precautions, NPO status , Patient's Chart, lab work & pertinent test results  History of Anesthesia Complications Negative for: history of anesthetic complications  Airway Mallampati: II  TM Distance: >3 FB Neck ROM: full    Dental  (+) Chipped   Pulmonary neg pulmonary ROS, neg shortness of breath   Pulmonary exam normal        Cardiovascular Exercise Tolerance: Good (-) angina (-) Past MI negative cardio ROS Normal cardiovascular exam     Neuro/Psych negative neurological ROS  negative psych ROS   GI/Hepatic negative GI ROS, Neg liver ROS,neg GERD  ,,  Endo/Other  negative endocrine ROS    Renal/GU      Musculoskeletal   Abdominal   Peds  Hematology negative hematology ROS (+)   Anesthesia Other Findings Past Medical History: No date: Herpes, genital 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, hpv+  Past Surgical History: 08/31/2023: BREAST BIOPSY; Left     Comment:  left breast stereo calcs, coil clip, path pending 08/31/2023: BREAST BIOPSY; Left     Comment:  MM LT BREAST BX W LOC DEV 1ST LESION IMAGE BX SPEC               STEREO GUIDE 08/31/2023 [facility]MO[facility]APHY 2005; 2015: CESAREAN SECTION     Comment:  [facility] x2 No date: CESAREAN SECTION     Reproductive/Obstetrics negative OB ROS                             Anesthesia Physical Anesthesia Plan  ASA: 2  Anesthesia Plan: General ETT   Post-op Pain Management:    Induction: Intravenous  PONV Risk Score and Plan: Ondansetron, Dexamethasone, Midazolam and Treatment may vary due to age or medical condition  Airway Management Planned: Oral ETT  Additional Equipment:   Intra-op Plan:   Post-operative Plan: Extubation in OR  Informed Consent: I have reviewed the patients History and Physical, chart,  labs and discussed the procedure including the risks, benefits and alternatives for the proposed anesthesia with the patient or authorized representative who has indicated his/her understanding and acceptance.     Dental Advisory Given  Plan Discussed with: Anesthesiologist, CRNA and Surgeon  Anesthesia Plan Comments: (Patient consented for risks of anesthesia including but not limited to:  - adverse reactions to medications - damage to eyes, teeth, lips or other oral mucosa - nerve damage due to positioning  - sore throat or hoarseness - Damage to heart, brain, nerves, lungs, other parts of body or loss of life  Patient voiced understanding and assent.)       Anesthesia Quick Evaluation

## 2023-10-19 NOTE — Transfer of Care (Signed)
 Immediate Anesthesia Transfer of Care Note  Patient: Raynesha A Dain  Procedure(s) Performed: PART MASTECTOMY,RADIO FREQUENCY LOCALIZER,AXILLARY SENTINEL NODE BIOPSY (Left: Breast) BILATERAL ONCOPLASTIC BREAST REDUCTION (Bilateral)  Patient Location: PACU  Anesthesia Type:General  Level of Consciousness: awake, drowsy, and patient cooperative  Airway & Oxygen Therapy: Patient Spontanous Breathing and Patient connected to face mask oxygen  Post-op Assessment: Report given to RN, Post -op Vital signs reviewed and stable, and Patient moving all extremities X 4  Post vital signs: Reviewed and stable  Last Vitals:  Vitals Value Taken Time  BP 109/76 10/19/23 1245  Temp 36.1 C 10/19/23 1240  Pulse 60 10/19/23 1245  Resp 8 10/19/23 1245  SpO2 100 % 10/19/23 1245  Vitals shown include unfiled device data.  Last Pain:  Vitals:   10/19/23 1240  TempSrc:   PainSc: Asleep         Complications: No notable events documented.

## 2023-10-19 NOTE — Op Note (Signed)
 Preoperative diagnosis: Left breast DCIS.  Postoperative diagnosis: Same.   Procedure: SCOUT tag-localized left breast partial mastectomy.                      Left axillary Sentinel Lymph node biopsy  Anesthesia: GETA  Surgeon: Dr. Sung Amabile  Wound Classification: Clean  Indications: Patient is a 43 y.o. female with a nonpalpable left breast mass noted on mammography with core biopsy demonstrating DCIS requires SCOUT localizer placement, partial mastectomy for treatment with sentinel lymph node biopsy.   Specimen: Left breast mass, Sentinel Lymph nodes x 2, posterior margin  Complications: None  Estimated Blood Loss: 10 mL  Findings: 1. Specimen mammography shows marker and SCOUT localizer on specimen 2. Pathology call refers gross examination of margins was negative by 2 mm from the posterior margin.  Due to the proximity, posterior margin additionally removed 3. No other palpable mass or lymph node identified.   Operation performed with curative intent:Yes  Tracer(s) used to identify sentinel nodes in the upfront surgery (non-neoadjuvant) setting (select all that apply):Radioactive Tracer  Tracer(s) used to identify sentinel nodes in the neoadjuvant setting (select all that apply):N/A  All nodes (colored or non-colored) present at the end of a dye-filled lymphatic channel were removed:N/A  All significantly radioactive nodes were removed:Yes  All palpable suspicious nodes were removed:Yes  Biopsy-proven positive nodes marked with clips prior to chemotherapy were identified and removed:N/A    Description of procedure: SCOUT localization was performed by radiology prior to procedure. In the nuclear medicine suite, the subareolar region was injected with Tc-99 sulfur colloid the morning of procedure. Localization studies were reviewed. The patient was taken to the operating room and placed supine on the operating table, and after general anesthesia the left breast and  axilla were prepped and draped in the usual sterile fashion. A time-out was completed verifying correct patient, procedure, site, positioning, and implant(s) and/or special equipment prior to beginning this procedure.  By identifying the SCOUT localizer, the probable trajectory and location of the mass was visualized.  Preplanned incision site marked by plastic surgery used for lumpectomy portion.  The skin incision was made after infusion of local. Flaps were raised and  Sharp and blunt dissection was then taken down to the mass, taking care to include the entire SCOUT localizer and a margin of grossly normal tissue. The specimen was removed. The specimen was oriented with paint. Imaging reviewed and the entire target lesion had been resected, with biopsy clip and localizer within the specimen.Gross margin analysis by pathology confirmed all margins cleared by 2 mm from posterior margin.  Due to the proximity to the posterior margin, additional tissue removed from that area, oriented with paint, passed off field pending pathology. A hand-held gamma probe was used to identify the location of the hottest spot in the axilla. An incision was made around the caudal axillary hairline. Sharp and blunt Dissection was carried down to subdermal facias. The probe was placed within wound and again, the point of maximal count was found. Dissection continue until nodule was identified. The probe was placed in contact with the node and 2300 counts were recorded. The node was excised in its entirety. Ex vivo, the node measured 24 counts when placed on the probe. The bed of the node measured less than 100 counts. No additional hot spots were identified, but cluster of somewhat enlarged and discolored lymph nodes noted around the site of the initial lymph node.  All visible and palpable  nodes around this area removed and passed off field pending pathology.  After confirming no additional pathologically enlarged or grossly  abnormal nodes, axillary wound irrigated, hemostasis was achieved and the wound closed in layers with  interrupted sutures of 3-0 Vicryl in deep dermal layer and a running subcuticular suture of Monocryl 4-0, then dressed with dermabond.  Breast lesion and left open for plastic surgery to continue with their portion of the procedure.  Please see their op note for further details. Sponge and instrument count correct at end of procedure documented above.

## 2023-10-19 NOTE — Anesthesia Postprocedure Evaluation (Signed)
 Anesthesia Post Note  Patient: Sheena [name]  Procedure(s) Performed: PART MASTECTOMY,RADIO FREQUENCY LOCALIZER,AXILLARY SENTINEL NODE BIOPSY (Left: Breast) BILATERAL ONCOPLASTIC BREAST REDUCTION (Bilateral)  Patient location during evaluation: PACU Anesthesia Type: General Level of consciousness: awake and alert Pain management: pain level controlled Vital Signs Assessment: post-procedure vital signs reviewed and stable Respiratory status: spontaneous breathing, nonlabored ventilation, respiratory function stable and patient connected to nasal cannula oxygen Cardiovascular status: blood pressure returned to baseline and stable Postop Assessment: no apparent nausea or vomiting Anesthetic complications: no   No notable events documented.   Last Vitals:  Vitals:   10/19/23 1330 10/19/23 1348  BP: 125/70 134/76  Pulse: (!) 54 (!) 55  Resp: 10 14  Temp:  (!) 36.3 C  SpO2: 99% 100%    Last Pain:  Vitals:   10/19/23 1348  TempSrc: Temporal  PainSc: 4                  Cleda Mccreedy Bobie Caris

## 2023-10-19 NOTE — Op Note (Signed)
 Breast Reduction Op note:    DATE OF PROCEDURE: 10/19/2023  LOCATION: [facility]  SURGEON: Foster Simpson, DO  ASSISTANT: Caroline More, PA  PREOPERATIVE DIAGNOSIS 1. Left breast cancer. 2. Breast asymmetry.  POSTOPERATIVE DIAGNOSIS Same as preoperative diagnosis.  PROCEDURES 1. Bilateral oncoplastic mastopexy / breast reduction.  COMPLICATIONS: None.  DRAINS: none  INDICATIONS FOR PROCEDURE Sheena [name] is a 43 y.o. year-old female born on 09-01-1980,with left breast cancer.  This would have resulted in severe breast asymmetry.   MRN: [id number]  CONSENT Informed consent was obtained directly from the patient. The risks, benefits and alternatives were fully discussed. Specific risks including but not limited to bleeding, infection, hematoma, seroma, scarring, pain, nipple necrosis, asymmetry, poor cosmetic results, and need for further surgery were discussed. The patient's questions were answered.  DESCRIPTION OF PROCEDURE  Patient was brought into the operating room and rested on the operating room table in the supine position.  SCDs were placed and appropriate padding was performed.  Antibiotics were given. The patient underwent general anesthesia and the chest was prepped and draped in a sterile fashion.  A timeout was performed and all information was confirmed to be correct by those in the room.  Right side: Preoperative markings were confirmed.  Incision lines were injected with local containing epinephrine.  After waiting for vasoconstriction, the marked lines were incised with a #15 blade.  A lollipop superomedial breast reduction was performed by de-epithelializing the pedicle, using bovie to create the superomedial pedicle, and removing breast tissue from the superior, lateral, and inferior portions of the breast.  Care was taken to not undermine the breast pedicle. Hemostasis was achieved.  The nipple was gently rotated into position and the soft  tissue closed with 3-0 and 4-0 Monocryl.   The pocket was irrigated and hemostasis confirmed.  Experel was placed in the muscle fascia. The deep tissues were approximated with 3-0 PDS sutures.  The skin was closed with deep dermal 3-0 PDS and subcuticular 3-0 Monocryl sutures.  The nipple and skin flaps had good capillary refill at the end of the procedure.    Left side: After general surgery completed their portion of the case which is dictated separately we started our portion of the procedure. Preoperative markings were confirmed.  Incision lines were injected with local containing epinephrine.  After waiting for vasoconstriction, the marked lines were incised with a #15 blade.  A Wise-pattern superomedial breast reduction was performed by de-epithelializing the pedicle, using bovie to create the superomedial pedicle, and removing breast tissue from the superior, lateral, and inferior portions of the breast.  Care was taken to not undermine the breast pedicle. Hemostasis was achieved.  Experel was placed at the inframammary fold. The nipple was gently rotated into position and the soft tissue was closed with 4-0 Monocryl.  The patient was sat upright and size and shape symmetry was confirmed.  The pocket was irrigated and hemostasis confirmed.  The deep tissues were approximated with 3-0 PDS sutures. The skin was closed with deep dermal 3-0 PDS and subcuticular 4-0 Monocryl sutures.  Dermabond was applied.  A breast binder and ABDs were placed.  The nipple and skin flaps had good capillary refill at the end of the procedure.  The patient tolerated the procedure well. The patient was allowed to wake from anesthesia and taken to the recovery room in satisfactory condition.  The advanced practice practitioner (APP) assisted throughout the case.  The APP was essential in retraction and counter traction when  needed to make the case progress smoothly.  This retraction and assistance made it possible to see the  tissue plans for the procedure.  The assistance was needed for blood control, tissue re-approximation and assisted with closure of the incision site.

## 2023-10-19 NOTE — Anesthesia Procedure Notes (Signed)
 Procedure Name: Intubation Date/Time: 10/19/2023 10:53 AM  Performed by: Lanell Matar, CRNAPre-anesthesia Checklist: Patient identified, Emergency Drugs available, Suction available and Patient being monitored Patient Re-evaluated:Patient Re-evaluated prior to induction Oxygen Delivery Method: Circle System Utilized Preoxygenation: Pre-oxygenation with 100% oxygen Induction Type: IV induction Ventilation: Mask ventilation without difficulty Laryngoscope Size: McGrath and 4 Grade View: Grade I Tube type: Oral Tube size: 7.0 mm Number of attempts: 1 Airway Equipment and Method: Stylet and Oral airway Placement Confirmation: ETT inserted through vocal cords under direct vision, positive ETCO2 and breath sounds checked- equal and bilateral Secured at: 21 cm Tube secured with: Tape Dental Injury: Teeth and Oropharynx as per pre-operative assessment

## 2023-10-20 ENCOUNTER — Ambulatory Visit: Payer: Self-pay | Admitting: Surgical

## 2023-10-20 ENCOUNTER — Encounter: Payer: Self-pay | Admitting: Surgery

## 2023-10-20 DIAGNOSIS — D0512 Intraductal carcinoma in situ of left breast: Secondary | ICD-10-CM

## 2023-10-20 DIAGNOSIS — Z803 Family history of malignant neoplasm of breast: Secondary | ICD-10-CM

## 2023-10-20 NOTE — Progress Notes (Signed)
     Patient ID: Raford Pitcher, female    DOB: [birth year], 43 y.o.   MRN: [id number]  No chief complaint on file.     ICD-10-CM   1. Ductal carcinoma in situ (DCIS) of left breast  D05.12     2. Family history of breast cancer  Z80.3       History of Present Illness: SIENNA STONEHOCKER is a 43 y.o.  female who presents for virtual post operative evaluation after bilateral oncoplastic reduction/mastopexy with Dr.  Ulice Bold  She reports she is overall doing really well, pain is well-controlled.  She reports normal urination, no BMs or flatus yet.  She is not having any infectious symptoms.  She does not have any specific concerns.  She does have questions about when she can shower.  The patient gave consent to have this visit done by telemedicine / virtual visit, two identifiers were used to identify patient. This is also consent for access the chart and treat the patient via this visit. The patient is located in [place].  I, the provider, am at the office.  We spent 8 minutes together for the visit.  Joined by telephone.   Assessment/Plan:  She is doing really well, no concerns for infection at this time.  Discussed increasing water intake and using MiraLAX if she does not begin to have bowel movements by today or tomorrow.  All of her questions were answered to her content.    Encouraged her to avoid ice over breast.  Recommend continuing to avoid strenuous activities/heavy lifting Recommend calling with questions or concerns

## 2023-10-24 ENCOUNTER — Other Ambulatory Visit: Payer: Self-pay | Admitting: Pathology

## 2023-10-24 LAB — SURGICAL PATHOLOGY

## 2023-10-25 ENCOUNTER — Encounter: Payer: Self-pay | Admitting: *Deleted

## 2023-10-27 ENCOUNTER — Ambulatory Visit: Payer: BC Managed Care – PPO | Admitting: Plastic Surgery

## 2023-10-27 VITALS — BP 164/78 | HR 73 | Ht 60.0 in | Wt 260.0 lb

## 2023-10-27 DIAGNOSIS — D0512 Intraductal carcinoma in situ of left breast: Secondary | ICD-10-CM

## 2023-10-28 NOTE — Progress Notes (Signed)
   Subjective:    Patient ID: Sheena [name], female    DOB: 1980/12/08, 43 y.o.   MRN: [id number]  The patient is a 43 year old female here for follow-up.  She underwent surgery February 27 with a partial mastectomy of the left breast and oncoplastic breast reduction/mastopexy bilaterally.  There is no sign of a hematoma.  No sign of infection.  Her pain is well-controlled. She has a little bit of firmness in the axilla on the left side.  This is related to the lymph node surgery.  I encouraged her to use a little bit of ice over the next couple of days and massage to see if she can get that to soften up.   I think that it will.  All areas of skin looked nice and healthy.     Review of Systems  Constitutional:  Positive for activity change.  Eyes: Negative.   Respiratory: Negative.    Cardiovascular: Negative.   Gastrointestinal: Negative.        Objective:   Physical Exam Constitutional:      Appearance: Normal appearance.  Cardiovascular:     Rate and Rhythm: Normal rate.     Pulses: Normal pulses.  Skin:    Capillary Refill: Capillary refill takes less than 2 seconds.  Neurological:     Mental Status: She is alert and oriented to person, place, and time.  Psychiatric:        Mood and Affect: Mood normal.        Behavior: Behavior normal.        Assessment & Plan:     ICD-10-CM   1. Ductal carcinoma in situ (DCIS) of left breast  D05.12        Continue with the sports bra or a binder. Pictures were obtained of the patient and placed in the chart with the patient's or guardian's permission.

## 2023-10-30 ENCOUNTER — Other Ambulatory Visit

## 2023-10-30 NOTE — Progress Notes (Signed)
 Patient is a 43 year old female who underwent Scout tag localized left breast partial mastectomy and left axillary sentinel lymph node biopsy with Dr. Tonna Boehringer followed by immediate bilateral oncoplastic mastopexy/breast reduction with Dr. Ulice Bold on 10/19/2023.  Patient is a little over 2 weeks postop.  She presents to the clinic today for postoperative follow-up.  Patient was last seen in the clinic on 10/27/2023.  At this visit, there was no sign of hematoma.  There is no sign of infection.  Her pain was well-controlled.  There was a little bit of firmness in the left axilla on the left side, related to the lymph node surgery.  It was recommended that patient apply ice over the area and massage to see if that would help soften it up.  All areas of the skin looked nice and healthy.  Today, patient reports she is doing well.  She reports that she has to go back for reexcision of tissue with Dr. Tonna Boehringer given that margins were very close.  She is unsure when surgery will be.  Patient states otherwise she is doing really well.  She denies any fevers or chills.  She denies any pain or drainage.  Chaperone present on exam.  On exam, patient is sitting upright in no acute distress.  Breasts are soft and symmetric.  There is no overlying erythema.  There are no obvious fluid collections on exam.  NAC's appear to be healthy bilaterally.  Sensation intact to the NAC's bilaterally.  Incisions are intact with Steri-Strips.  There is no surrounding erythema.  No drainage noted.  There is a little bit of firmness to the left axilla, consistent with some scar tissue.  Discussed with patient that she may start applying Vaseline around her NAC's bilaterally.  Discussed with her Steri-Strips should fall off on their own over the next few weeks.  Instructed her to keep wearing her compression bra at all times except when showering.  Patient expressed understanding.  Patient states that she had firmness to the left axilla  evaluated by Dr. Tonna Boehringer and was told it is most likely scar tissue.  Recommend that the patient follow back up in 2 weeks.  Instructed her to call in the meantime if she has any questions about anything.  Pictures were obtained of the patient and placed in the chart with the patient's or guardian's permission.

## 2023-10-30 NOTE — Progress Notes (Signed)
 Tumor Board Documentation  SHARESA KEMP was presented by Dr. Cathie Hoops at our Tumor Board on 10/30/2023, which included representatives from medical oncology, radiation oncology, surgical, radiology, pathology, navigation.  Dejanay currently presents as a current patient, for MDC with history of the following treatments: surgical intervention(s).  Additionally, we reviewed previous medical and familial history, history of present illness, and recent lab results along with all available histopathologic and imaging studies. The tumor board considered available treatment options and made the following recommendations: Surgery, Hormonal therapy Re-excision based on close margin, followed by endocrine therapy  The following procedures/referrals were also placed: No orders of the defined types were placed in this encounter.   Clinical Trial Status: not discussed   Staging used:AJCC Staging: T: pTis N: pN0      National site-specific guidelines   were discussed with respect to the case.  Tumor board is a meeting of clinicians from various specialty areas who evaluate and discuss patients for whom a multidisciplinary approach is being considered. Final determinations in the plan of care are those of the provider(s). The responsibility for follow up of recommendations given during tumor board is that of the provider.   Today's extended care, comprehensive team conference, Edelmira was not present for the discussion and was not examined.   Multidisciplinary Tumor Board is a multidisciplinary case peer review process.  Decisions discussed in the Multidisciplinary Tumor Board reflect the opinions of the specialists present at the conference without having examined the patient.  Ultimately, treatment and diagnostic decisions rest with the primary provider(s) and the patient.

## 2023-10-31 ENCOUNTER — Telehealth: Payer: Self-pay

## 2023-10-31 NOTE — Telephone Encounter (Signed)
 Patient called stating she was here Friday to see the provider, paid $25.00 for FMLA/Disability paperwork, and wanting to know if we sent to [employer].  Advised patient I do not see any documents scanned into her chart but I will send a message for the provider & CMAs to located the document and to determine if it was faxed.  Patient verbalized we have it because she left it here Friday.

## 2023-10-31 NOTE — Telephone Encounter (Signed)
 Form completed, will be faxed today by admin staff

## 2023-11-01 ENCOUNTER — Inpatient Hospital Stay: Payer: BC Managed Care – PPO | Attending: Oncology | Admitting: Oncology

## 2023-11-01 ENCOUNTER — Encounter: Payer: Self-pay | Admitting: Oncology

## 2023-11-01 VITALS — BP 107/70 | HR 79 | Temp 97.8°F | Resp 16 | Wt 135.0 lb

## 2023-11-01 DIAGNOSIS — D0512 Intraductal carcinoma in situ of left breast: Secondary | ICD-10-CM | POA: Diagnosis not present

## 2023-11-01 DIAGNOSIS — Z803 Family history of malignant neoplasm of breast: Secondary | ICD-10-CM

## 2023-11-01 NOTE — Assessment & Plan Note (Signed)
 genetic testing showed no pathological mutations.

## 2023-11-01 NOTE — Progress Notes (Signed)
 Patient was in here on Friday 10/27/2023, and she was documented in her chart as being 280.0 lbs, and while here today her weight was 135.0 lbs, patient noticed in her MyChart.

## 2023-11-01 NOTE — Progress Notes (Signed)
 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  Margin is close 0.73mm  Recommend re-excision to achieve margin >57mm. She has discussed with surgery and pending scheduling her surgery. Refer to establish care with Radonc for adjuvant radiation.  We discussed about adjuvant endocrine therapy after RT  Family history of breast cancer genetic testing showed no pathological mutations.    Orders Placed This Encounter  Procedures   Ambulatory referral to Radiation Oncology    Referral Priority:   Routine    Referral Type:   Consultation    Referral Reason:   Specialty Services Required    Requested Specialty:   Radiation Oncology    Number of Visits Requested:   1   Follow up TBD All questions were answered. The patient knows to call the clinic with any problems, questions or concerns.  Rickard Patience, MD, PhD [facility] Hematology Oncology 11/01/2023    HISTORY OF PRESENTING ILLNESS:  Sheena [name] 43 y.o. female presents to establish care for left breast high grade DCIS I have reviewed her chart and materials related to her cancer extensively and collaborated history with the patient. Summary of oncologic history is as follows: Oncology History  Ductal carcinoma in situ (DCIS) of left breast  07/31/2023 Mammogram   Screening mammogram -Further evaluation is suggested for calcifications in the left breast.   08/25/2023 Imaging   Left breast mammogram IMPRESSION: LEFT breast  12 mm group of amorphous calcification in the outer central position is low suspicion for malignancy. Recommend further assessment with stereotactic guided biopsy.   RECOMMENDATION: LEFT breast stereotactic guided biopsy (1 site)   08/31/2023 Initial Diagnosis   Ductal carcinoma in situ (DCIS) of left breast  Patient underwent left breast biopsy  1. Breast, left, needle core biopsy, lower outer :       - HIGH-GRADE DUCTAL CARCINOMA IN SITU (DCIS), COMEDO-TYPE WITH CALCIFICATION    Menarche at age of 14 First live birth at age of 65 OCP use: no Menopausal status: having period  History of HRT use: No History of chest radiation: NO Number of previous breast biopsies:  No    09/06/2023 Cancer Staging   Staging form: Breast, AJCC 8th Edition - Clinical stage from 09/06/2023: Stage 0 (cTis (DCIS), cN0, cM0, G3) - Signed by Rickard Patience, MD on 09/06/2023 Stage prefix: Initial diagnosis Histologic grading system: 3 grade system    Genetic Testing   Negative/normal genetic testing with [employer]'s 39 gene CancerNext +RNAinsight panel. The [employer] CancerNext+RNAinsight Panel includes sequencing, rearrangement analysis, and RNA analysis for the following 39 genes: APC, ATM, BAP1, BARD1, BMPR1A, BRCA1, BRCA2, BRIP1, CDH1, CDKN2A, CHEK2, FH, FLCN, MET, MLH1, MSH2, MSH6, MUTYH, NF1, NTHL1, PALB2, PMS2, PTEN, RAD51C, RAD51D, SMAD4, STK11, TP53, TSC1, TSC2, and VHL (sequencing and deletion/duplication); AXIN2, HOXB13, MBD4, MSH3, POLD1 and POLE (sequencing only); EPCAM and GREM1 (deletion/duplication only). Report date 09/23/23.     She presents to discuss pathology results. No concerns at surgical site.   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, hpv+    SURGICAL HISTORY: Past Surgical History:  Procedure Laterality Date   BREAST BIOPSY Left 08/31/2023   left breast stereo calcs, coil clip, path pending   BREAST BIOPSY Left  08/31/2023   MM LT BREAST BX W LOC DEV 1ST LESION IMAGE BX SPEC STEREO GUIDE 08/31/2023 [facility]   BREAST REDUCTION SURGERY Bilateral 10/19/2023   Procedure: BILATERAL ONCOPLASTIC BREAST REDUCTION;  Surgeon: Peggye Form, DO;  Location: [facility] OR[facility];  Service: Plastics;  Laterality: Bilateral;   CESAREAN SECTION  2005; 2015   [facility] x2   CESAREAN SECTION     PART MASTECTOMY,RADIO FREQUENCY LOCALIZER,AXILLARY SENTINEL NODE BIOPSY Left 10/19/2023   Procedure: PART MASTECTOMY,RADIO FREQUENCY LOCALIZER,AXILLARY SENTINEL NODE BIOPSY;  Surgeon: Sung Amabile, DO;  Location: [facility];  Service: General;  Laterality: Left;    SOCIAL HISTORY: Social History   Socioeconomic History   Marital status: Single    Spouse name: Not on file   Number of children: 2   Years of education: Not on file   Highest education level: Not on file  Occupational History   Not on file  Tobacco Use   Smoking status: Never   Smokeless tobacco: Never  Vaping Use   Vaping status: Never Used  Substance and Sexual Activity   Alcohol use: Never   Drug use: Never   Sexual activity: Not Currently    Birth control/protection: None  Other Topics Concern   Not on file  Social History Narrative   Not on file   Social Drivers of Health   Financial Resource Strain: Low Risk  (07/13/2023)   Received from [facility]   Overall Financial Resource Strain (CARDIA)    Difficulty of Paying Living Expenses: Not hard at all  Food Insecurity: No Food Insecurity (07/13/2023)   Received from [facility]   Hunger Vital Sign    Worried About Running Out of Food in the Last Year: Never true    Ran Out of Food in the Last Year: Never true  Transportation Needs: Not on file  Physical Activity: Not on file  Stress: Not on file  Social Connections: Not on file  Intimate Partner Violence: Not on file    FAMILY HISTORY: Family History  Problem Relation Age of Onset   Cancer Mother         breast   Breast cancer Mother 27   Cancer Father 88 - 40       prostate   Prostate cancer Father    Cancer Maternal Aunt 9       metastatic, unknown primary   Cancer Maternal Aunt 79       gallbladder    ALLERGIES:  has no known allergies.  MEDICATIONS:  Current Outpatient Medications  Medication Sig Dispense Refill   acetaminophen (TYLENOL) 500 MG tablet Take 1 tablet (500 mg total) by mouth every 8 (eight) hours as needed for moderate pain (pain score 4-6), mild pain (pain score 1-3), fever or headache. 30 tablet 0   ibuprofen (ADVIL) 800 MG tablet Take 1 tablet (800 mg total) by mouth every 8 (eight) hours as needed for mild pain (pain score 1-3) or moderate pain (pain score 4-6). 30 tablet 0   Multiple Vitamin (MULTIVITAMIN) tablet Take 1 tablet by mouth daily.     No current facility-administered medications for this visit.    Review  of Systems  Constitutional:  Negative for appetite change, chills, fatigue and fever.  HENT:   Negative for hearing loss and voice change.   Eyes:  Negative for eye problems.  Respiratory:  Negative for chest tightness and cough.   Cardiovascular:  Negative for chest pain.  Gastrointestinal:  Negative for abdominal distention, abdominal pain and blood in stool.  Endocrine: Negative for hot flashes.  Genitourinary:  Negative for difficulty urinating and frequency.   Musculoskeletal:  Negative for arthralgias.  Skin:  Negative for itching and rash.  Neurological:  Negative for extremity weakness.  Hematological:  Negative for adenopathy.  Psychiatric/Behavioral:  Negative for confusion.      PHYSICAL EXAMINATION: ECOG PERFORMANCE STATUS: 0 - Asymptomatic  Vitals:   11/01/23 1037  BP: 107/70  Pulse: 79  Resp: 16  Temp: 97.8 F (36.6 C)  SpO2: 100%   Filed Weights   11/01/23 1037  Weight: 135 lb (61.2 kg)    Physical Exam Constitutional:      General: She is not in acute distress.    Appearance: She is not diaphoretic.   HENT:     Head: Normocephalic and atraumatic.  Eyes:     General: No scleral icterus. Cardiovascular:     Rate and Rhythm: Normal rate and regular rhythm.     Heart sounds: No murmur heard. Pulmonary:     Effort: Pulmonary effort is normal. No respiratory distress.     Breath sounds: No wheezing.  Abdominal:     General: There is no distension.     Palpations: Abdomen is soft.     Tenderness: There is no abdominal tenderness.  Musculoskeletal:        General: Normal range of motion.     Cervical back: Normal range of motion and neck supple.  Skin:    General: Skin is warm and dry.     Findings: No erythema.  Neurological:     Mental Status: She is alert and oriented to person, place, and time. Mental status is at baseline.     Cranial Nerves: No cranial nerve deficit.     Motor: No abnormal muscle tone.  Psychiatric:        Mood and Affect: Affect normal.     Comments: Tearful    Patient declined breast examination as she was examined by Dr. Tonna Boehringer yesterday  LABORATORY DATA:  I have reviewed the data as listed    Latest Ref Rng & Units 09/06/2023   12:21 PM 02/04/2021    7:51 AM 01/02/2014    2:10 AM  CBC  WBC 4.0 - 10.5 K/uL 5.7  5.8  7.5   Hemoglobin 12.0 - 15.0 g/dL 32.2  02.5  42.7   Hematocrit 36.0 - 46.0 % 38.0  34.9  39.1   Platelets 150 - 400 K/uL 407  355  400       Latest Ref Rng & Units 09/06/2023   12:21 PM 02/04/2021    7:51 AM 01/02/2014    2:10 AM  CMP  Glucose 70 - 99 mg/dL 062  80  96   BUN 6 - 20 mg/dL 15  10  4    Creatinine 0.44 - 1.00 mg/dL 3.76  2.83  1.51   Sodium 135 - 145 mmol/L 135  140  139   Potassium 3.5 - 5.1 mmol/L 4.2  4.1  3.1   Chloride 98 - 111 mmol/L 102  100  103   CO2 22 - 32 mmol/L 24  22  24   Calcium 8.9 - 10.3 mg/dL 9.6  71.0  9.7   Total Protein 6.5 - 8.1 g/dL 8.1  7.7  8.4   Total Bilirubin 0.0 - 1.2 mg/dL 0.9  0.2  0.7   Alkaline Phos 38 - 126 U/L 48  53  76   AST 15 - 41 U/L 19  24  24    ALT 0 - 44 U/L 27  25  23        RADIOGRAPHIC STUDIES: I have personally reviewed the radiological images as listed and agreed with the findings in the report. MM Breast Surgical Specimen Result Date: 10/23/2023 CLINICAL DATA:  Status post [facility] localized LEFT breast lumpectomy. Patient is status post stereotactic guided biopsy of the LEFT breast which demonstrated ductal carcinoma in-situ (COIL clip). Patient underwent SAVI scout localization of the clip. EXAM: SPECIMEN RADIOGRAPH OF THE LEFT BREAST COMPARISON:  Previous exam(s). FINDINGS: Status post excision of the LEFT breast. The [facility] reflector and COIL shaped clip are present within the specimen. IMPRESSION: Specimen radiograph of the LEFT breast. Electronically Signed   By: Meda Klinefelter M.D.   On: 10/23/2023 09:53   MM CLIP PLACEMENT LEFT Addendum Date: 10/23/2023 ADDENDUM REPORT: 10/23/2023 08:35 ADDENDUM: ACR breast density category d: The breasts are extremely dense, which lowers the sensitivity of mammography. Electronically Signed   By: Baird Lyons M.D.   On: 10/23/2023 08:35   Result Date: 10/23/2023 CLINICAL DATA:  Status post MR guided core biopsy of the left breast. EXAM: 3D DIAGNOSTIC LEFT MAMMOGRAM POST ULTRASOUND BIOPSY COMPARISON:  Previous exam(s). FINDINGS: 3D Mammographic images were obtained following MRI guided biopsy of the left breast. The biopsy marking clip is in the lateral aspect of the left breast. IMPRESSION: Appropriate positioning of the barbell shaped biopsy marking clip at the site of biopsy in the lateral aspect of the left breast. The barbell shaped clip is located 2.5 cm superior to the coil shaped clip. Final Assessment: Post Procedure Mammograms for Marker Placement Electronically Signed: By: Baird Lyons M.D. On: 10/12/2023 10:43   NM Sentinel Node Inj-No Rpt (Breast) Result Date: 10/19/2023 Sulfur Colloid was injected by the Nuclear Medicine Technologist for sentinel lymph node localization.   MM LT RADIO FREQUENCY TAG  LOC MAMMO GUIDE Result Date: 10/17/2023 CLINICAL DATA:  Patient is status post stereotactic guided biopsy of the LEFT breast which demonstrated ductal carcinoma in-situ (COIL clip). Benign MRI biopsy at barbell clip. EXAM: NEEDLE LOCALIZATION OF THE LEFT BREAST WITH MAMMO GUIDANCE COMPARISON:  Previous exam(s). FINDINGS: Patient presents for needle localization prior to lumpectomy. I met with the patient and we discussed the procedure of needle localization including benefits and alternatives. We discussed the high likelihood of a successful procedure. We discussed the risks of the procedure, including infection, bleeding, tissue injury, and further surgery. Informed, written consent was given. The usual time-out protocol was performed immediately prior to the procedure. Using mammographic guidance, sterile technique, 1% lidocaine and a 10 cm SAVI SCOUT needle, the COIL clip was localized using a lateral approach. Subsequent two-view mammogram was performed. The images were marked for Dr. Tonna Boehringer. IMPRESSION: Radar reflector localization of the LEFT breast. No apparent complications. Electronically Signed   By: Meda Klinefelter M.D.   On: 10/17/2023 08:04   MR LT BREAST BX W LOC DEV 1ST LESION IMAGE BX SPEC MR GUIDE Addendum Date: 10/13/2023 ADDENDUM REPORT: 10/13/2023 12:19 ADDENDUM: Pathology revealed BENIGN BREAST SHOWING STROMAL FIBROSIS AND FIBROMATOID CHANGE, NEGATIVE FOR MICROCALCIFICATIONS of the LEFT  breast, lower outer quadrant, (dumbbell clip). This was found to be concordant by Dr. Baird Lyons. Pathology results were discussed with the patient by telephone. The patient reported doing well after the biopsy with tenderness at the site. Post biopsy instructions and care were reviewed and questions were answered. The patient was encouraged to call [facility] for any additional concerns. The patient has a recent diagnosis of LEFT breast cancer and should follow her outlined  treatment plan. Dr. Harrie Foreman was notified of results via secure EPIC message on October 13, 2023. Pathology results reported by Rene Kocher, RN on 10/13/2023. Electronically Signed   By: Baird Lyons M.D.   On: 10/13/2023 12:19   Result Date: 10/13/2023 CLINICAL DATA:  Biopsy-proven DCIS in the left breast. Second area of enhancement in the lower outer quadrant. Biopsy recommended. EXAM: MRI GUIDED CORE NEEDLE BIOPSY OF THE LEFT BREAST TECHNIQUE: Multiplanar, multisequence MR imaging of the left breast was performed both before and after administration of intravenous contrast. CONTRAST:  6 cc of Vueway COMPARISON:  Previous exam(s). FINDINGS: I met with the patient, and we discussed the procedure of MRI guided biopsy, including risks, benefits, and alternatives. Specifically, we discussed the risks of infection, bleeding, tissue injury, clip migration, and inadequate sampling. Informed, written consent was given. The usual time out protocol was performed immediately prior to the procedure. Using sterile technique, 1% lidocaine and 1% lidocaine with epinephrine, MRI guidance, and a 9 gauge vacuum assisted device, biopsy was performed of enhancement in the lower outer quadrant of the left breast using a lateral to medial approach. At the conclusion of the procedure, a dumbbell tissue marker clip was deployed into the biopsy cavity. Follow-up 2-view mammogram was performed and dictated separately. IMPRESSION: MRI guided biopsy of the left breast.  No apparent complications. Electronically Signed: By: Baird Lyons M.D. On: 10/12/2023 10:04

## 2023-11-01 NOTE — Assessment & Plan Note (Signed)
 High grade DCIS pTis pN0  ER95% + S/p lumpectomy, SLNB and bilateral breast reduction.  Margin is close 0.12mm  Recommend re-excision to achieve margin >66mm. She has discussed with surgery and pending scheduling her surgery. Refer to establish care with Radonc for adjuvant radiation.  We discussed about adjuvant endocrine therapy after RT

## 2023-11-02 ENCOUNTER — Encounter: Payer: Self-pay | Admitting: *Deleted

## 2023-11-02 ENCOUNTER — Encounter: Payer: Self-pay | Admitting: Plastic Surgery

## 2023-11-06 ENCOUNTER — Ambulatory Visit: Admitting: Student

## 2023-11-06 DIAGNOSIS — D0512 Intraductal carcinoma in situ of left breast: Secondary | ICD-10-CM

## 2023-11-08 ENCOUNTER — Ambulatory Visit: Payer: BC Managed Care – PPO | Admitting: Occupational Therapy

## 2023-11-09 ENCOUNTER — Encounter: Payer: Self-pay | Admitting: *Deleted

## 2023-11-09 NOTE — Progress Notes (Signed)
 Sheena [name] called asking if I had heard anything about her re-excision getting scheduled.   I spoke with Sherri at Dr. Geoffery Lyons office and she said they are coordinating with Dr. Ulice Bold and should hopefully have a date by Monday.

## 2023-11-13 ENCOUNTER — Encounter: Payer: Self-pay | Admitting: *Deleted

## 2023-11-13 NOTE — Progress Notes (Signed)
 Sheena [name] called to ask if I had heard anything about her re-excision being scheduled.   I sent Dr. Tonna Boehringer a message and spoke with his nurse Shaunte.   Sheena [name] is going to call me back and let me know more information after she speaks with Dr. Tonna Boehringer.

## 2023-11-14 ENCOUNTER — Encounter: Payer: Self-pay | Admitting: *Deleted

## 2023-11-14 NOTE — Progress Notes (Signed)
 Re-excision is tentatively scheduled for 4/17.   Her appointments with Dr. Rushie Chestnut and [name] have been moved to May 7.

## 2023-11-15 ENCOUNTER — Ambulatory Visit: Admitting: Radiation Oncology

## 2023-11-15 ENCOUNTER — Inpatient Hospital Stay: Admitting: Occupational Therapy

## 2023-11-20 ENCOUNTER — Encounter: Payer: Self-pay | Admitting: Student

## 2023-11-20 ENCOUNTER — Ambulatory Visit (INDEPENDENT_AMBULATORY_CARE_PROVIDER_SITE_OTHER): Admitting: Student

## 2023-11-20 VITALS — BP 118/67 | HR 86 | Ht 65.0 in | Wt 136.8 lb

## 2023-11-20 DIAGNOSIS — D0512 Intraductal carcinoma in situ of left breast: Secondary | ICD-10-CM

## 2023-11-20 NOTE — Progress Notes (Signed)
 Patient is a 43 year old female who underwent Scout tag localized left breast partial mastectomy and left axillary sentinel lymph node biopsy with Dr. Tonna Boehringer followed by immediate bilateral oncoplastic mastopexy/breast reduction with Dr. Ulice Bold on 10/19/2023.  Patient is a little over 4 weeks postop.  She presents to the clinic today for postoperative follow-up.  Patient was last seen in the clinic on 11/06/2023.  At this visit, patient was doing well.  On exam, breasts are soft and symmetric.  There is no overlying erythema or obvious fluid collections on exam.  NAC's were healthy bilaterally.  There is a little bit of firmness noted to the left axilla, consistent with some scar tissue.  Today, patient reports she is doing well.  She states that her surgery for reexcision with Dr. Tonna Boehringer has been scheduled for 12/07/2023.  Patient reports she has a little bit of firmness to her left breast near her left NAC.  She otherwise denies any issues or concerns with her surgical sites.  She denies any fevers or chills.  She denies any drainage from either of her breasts.  Chaperone present on exam.  On exam, patient is sitting upright in no acute distress.  Breasts are soft and fairly symmetric.  There is no overlying erythema to either breast.  There are no obvious fluid collections palpated on exam.  There is a little bit of firmness noted to the inferior aspects of the vertical limb incisions bilaterally that appear to be consistent with scar tissue.  There is some firmness noted near the left NAC laterally that appears to be consistent with fat necrosis versus scar tissue.  NAC's appear to be healthy bilaterally.  Incisions are intact and well-healed.  There was several suture knots noted on exam.  These were cut and removed.  Patient tolerated well.  No signs of infection on exam.  I recommended that patient apply Vaseline to her incisions for another week or so.  Discussed with her then she may transition to  scar creams such as Mederma, silicone-based scar creams or silicone tapes.  Patient expressed understanding.  Recommended that patient massage the area of firmness near her left NAC as well as the other areas of firmness..  Discussed with her that this could be fat necrosis versus scar tissue.  Discussed with her to continue to closely monitor it and that if it were to become larger or if she was concerned about it to contact us.  Patient expressed understanding.  Discussed with patient to continue her sports bra for now and avoid strenuous activities.  We will plan to see the patient back in a month.  I instructed her to call in the meantime she has any questions or concerns about anything.

## 2023-11-29 ENCOUNTER — Other Ambulatory Visit: Payer: Self-pay

## 2023-11-29 ENCOUNTER — Ambulatory Visit: Payer: Self-pay | Admitting: Surgery

## 2023-11-29 ENCOUNTER — Encounter
Admission: RE | Admit: 2023-11-29 | Discharge: 2023-11-29 | Disposition: A | Discharge status: Home / Self Care | Source: Ambulatory Visit | Attending: Surgery | Admitting: Surgery

## 2023-11-29 DIAGNOSIS — Z01812 Encounter for preprocedural laboratory examination: Secondary | ICD-10-CM

## 2023-11-29 HISTORY — DX: Anemia, unspecified: D64.9

## 2023-11-29 NOTE — H&P (View-Only) (Signed)
 Subjective:    CC: Ductal carcinoma in situ (DCIS) of left breast [D05.12] POSTOP   HPI:   Subjective Sheena [name] is a 43 y.o. female who is here for followup from above.  Swelling in axilla slowly improving.     Current Medications: has a current medication list which includes the following prescription(s): ergocalciferol (vitamin d2) and multivitamin.   Allergies:  Allergies  No Known Allergies     ROS: General: Denies weight loss, weight gain, fatigue, fevers, chills, and night sweats. Heart: Denies chest pain, palpitations, racing heart, irregular heartbeat, leg pain or swelling, and decreased activity tolerance. Respiratory: Denies breathing difficulty, shortness of breath, wheezing, cough, and sputum. GI: Denies change in appetite, heartburn, nausea, vomiting, constipation, diarrhea, and blood in stool. GU: Denies difficulty urinating, pain with urinating, urgency, frequency, blood in urine     Objective:      Objective BP 110/70   Pulse 79   Ht 165.1 cm (5\' 5" )   Wt 60.8 kg (134 lb)   LMP 10/11/2023   BMI 22.30 kg/m    Constitutional :  Alert, no distress, cooperative  Gastrointestinal: soft, non-tender; bowel sounds normal; no masses,  no organomegaly.   Musculoskeletal: Steady gait and movement  Skin: Cool and moist, incisions clean, dry, intact.  No erythema, induration or drainage to indicate infection.    Psychiatric: Normal affect, non-agitated, not confused         LABS:  Component 13 d ago  SURGICAL PATHOLOGY SURGICAL PATHOLOGY [facility] [address], [address] [place], [place] [address] Telephone [phone] or [phone] Fax [phone]  REPORT OF SURGICAL PATHOLOGY   Accession #: [id number] Patient Name: Sheena [name], Sheena [name] Visit # : [id number]  MRN: [id number] Physician: Sung Amabile DOB/Age Nov 06, 1980 (Age: 10) Gender: F Collected Date: 10/19/2023 Received Date: 10/20/2023  FINAL DIAGNOSIS       1. Breast,  lumpectomy, left mass :      - HIGH-GRADE DUCTAL CARCINOMA IN SITU (DCIS) WITH COMEDO NECROSIS AND      CALCIFICATION AND FOCALLY INVOLVING AN INTRADUCTAL PAPILLOMA.      - SEE CANCER SUMARY BELOW.      - SEPARATE BENIGN SPINDLE CELL PROLIFERATION, SEE NOTE.      - PRIOR BIOPSY SITE CHANGE WITH CLIP.      - SCOUT TAG PRESENT.       2. Lymph node, sentinel, biopsy, left :      - SIX LYMPH NODES NEGATIVE FOR MALIGNANCY (0/6).       3. Breast, partial mastectomy, left, posterior margin :      - FOCAL DUCTAL CARCINOMA IN SITU (DCIS); NEW POSTERIOR MARGIN IS NEGATIVE (0.17      MM TO NEW POSTERIOR MARGIN).      - SCLEROSING ADENOSIS, FIBROADENOMATOID CHANGE, AND USUAL DUCTAL HYPERPLASIA.       4. Breast, partial mastectomy, right lower breast tissue :      - SCLEROSING ADENOSIS AND FIBROADENOMATOID CHANGE.      - NEGATIVE FOR MALIGNANCY.       5. Breast, partial mastectomy, right lateral breast tissue :      - SCLEROSING ADENOSIS AND FIBROADENOMATOID CHANGE.      - NEGATIVE FOR MALIGNANCY.       6. Breast, partial mastectomy, inferior left breast :      - SCLEROSING ADENOSIS AND FIBROADENOMATOID CHANGE.      - NEGATIVE FOR MALIGNANCY.       Diagnosis Note : IHC for myoepithelial  markers p63, calponin, and SMM1 were      performed on the area of DCIS and confirm the above diagnosis.Stain controls      worked appropriately.      In the left lumpectomy specimen (specimen 1) a separate, not previously      biopsied, bland spindle cell lesion was identified. The lesional spindle cells      are positive for SMA and negative for CD34, p63, CKAE1/3, CK903, CK5/6. and      CK8/18. 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 The slides      on this spindle cell lesion underwent intradepartmental consultation and Dr. Reynolds Bowl      concurs with the interpretation.      ELECTRONIC SIGNATURE : Rubinas Md, Delice Bison , Sports administrator, International aid/development worker  MICROSCOPIC DESCRIPTION 1.  CANCER CASE SUMMARY: DUCTAL CARCINOMA IN SITU OF THE BREAST Standard(s): AJCC-UICC 8  SPECIMEN Procedure: Lumpectomy Specimen Laterality: Left  TUMOR Histologic Type: Ductal carcinoma in situ Size (Extent) of DCIS:  Estimated size (extent) of DCIS is at least 13 mm Nuclear Grade: Grade 3 (high) Necrosis: Present, central (expansive comedonecrosis)  MARGINS Margin Status: All margins negative for DCIS Distance from DCIS to closest margin: 0.17 mm Specify closest margin: Posterior  REGIONAL LYMPH NODES Regional Lymph node Status: All regional lymph nodes negative for tumor Total Number of Lymph Nodes Examined (sentinel and non-sentinel): 6 Number of Sentinel Nodes Examined: 6  DISTANT METASTASIS Distant Site(s) Involved, if applicable (select all that apply): Not applicable  PATHOLOGIC STAGE CLASSIFICATION (pTNM, AJCC 8th Edition) TNM Descriptors: Not applicable pTis (DCIS) Regional Lymph Nodes Modifier: Not applicable pN0 pM - Not applicable  SPECIAL STUDIES Breast Biomarker Testing will be performed on block 1C and reported in an addendum. (v4.4.0.0)  CASE COMMENTS STAINS USED IN DIAGNOSIS: H&E H&E H&E H&E H&E H&E H&E H&E H&E H&E H&E H&E (a-SMA) Smooth Muscle Actin CD 34 CK 5/6 CK AE1AE3 CK HMW (903) (Clone [id number]) High Molecular Weight CK8/18 (LMW) P63 Calponin P63 Smooth Muscle Myosin - 1 Heavy Chain Universal Negative Control-DAB CK HMW (903) (Clone [id number]) High Molecular Weight Universal Negative Control-DAB *RECUT 1 SLIDE H&E H&E H&E H&E H&E H&E H&E H&E H&E H&E H&E H&E H&E H&E H&E H&E H&E H&E H&E H&E H&E H&E H&E H&E H&E H&E H&E H&E H&E H&E H&E Stains used in diagnosis 1 ER-ACIS Estrogen receptor (6F11), immunohistochemical stains are performed on formalin fixed, paraffin embedded tissue using a 3,3"-diaminobenzidine (DAB) chromogen and Leica Bond Autostainer System.  The staining intensity of the nucleus  is scored manually and is reported as the percentage of tumor cell nuclei demonstrating specific nuclear staining.Specimens are fixed in 10% Neutral Buffered Formalin for at least 6 hours and up to 72 hours.  These tests have not be validated on decalcified tissue.  Results should be interpreted with caution given the possibility of false negative results on decalcified specimens.  ADDENDUM Breast, lumpectomy, left mass PROGNOSTIC INDICATORS  Results: IMMUNOHISTOCHEMICAL AND MORPHOMETRIC ANALYSIS PERFORMED MANUALLY Estrogen Receptor:  95%, POSITIVE, MODERATE-STRONG STAINING INTENSITY REFERENCE RANGE ESTROGEN RECEPTOR NEGATIVE     0% POSITIVE       =>1% All controls stained appropriately [place] Md, Fred , Sports administrator, International aid/development worker [phone] 05 2025)        Intraoperative Diagnosis: Intraoperative consultation request: Gross margin evaluation (callback)       Intraoperative Diagnosis: Specimen A: "Left breast mass"       Intraoperative Diagnosis: Diagnosis: Biopsy cavity about 2 mm  from posterior (closest) margin       Intraoperative Diagnosis: Time received in lab: 10/19/23 at 1136       Intraoperative Diagnosis: Callback: 10/19/2023 at 1153       Intraoperative Diagnosis: Requesting physician: Dr. Luan Moore       Intraoperative Diagnosis: Rendering pathologist: Dr. M. [place]  CLINICAL HISTORY  SPECIMEN(S) OBTAINED 1. Breast, lumpectomy, Left Mass 2. Lymph node, sentinel, biopsy, Left 3. Breast, partial mastectomy, Left, Posterior Margin 4. Breast, partial mastectomy, Right Lower Breast Tissue 5. Breast, partial mastectomy, Right Lateral Breast Tissue 6. Breast, partial mastectomy, Inferior Left Breast  SPECIMEN COMMENTS: SPECIMEN CLINICAL INFORMATION:    Gross Description 1. Specimen: Breast lumpectomy, "left breast mass"      Size: 4.5 cm anterior-posterior, 4.5 cm superior-inferior, 4.5 cm medial-lateral      Orientation: The specimen is received inked and  is sectioned from anterior to      posterior      Anterior: Green      Posterior: Black      Superior: Red      Inferior: Blue      Medial: Yellow      Lateral: Orange      Localized area: Coil clip; SAVI scout      Lesion A: 2.1 x 1.5 x 1.2 cm; ill-defined, indurated fibrous area with embedded      localization devices      Margins:      Anterior: 0.7 cm      Posterior: 0.2 cm      Superior: 0.5 cm      Inferior: 0.6 cm      Medial: 0.4 cm      Lateral: 1.5 cm      Lesion B: 0.6 x 0.5 x 0.5 cm indurated, predominantly well circumscribed,      white-tan nodule; 0.4 cm from lesion A      Margins:      Anterior: 0.8 cm      Posterior: 3.0 cm      Superior: 0.2 cm      Inferior: 3.5 cm      Medial: 0.7 cm      Lateral: 3.1 cm      Remaining cut surfaces: 40% dense, white-tan fibrous tissue; 60% soft, lobulated      adipose tissue.      Prognostic indicators: Perform on paraffin blocks as needed.      Block summary:      1A-H: Lesion A, entire localized area (sequentially from posterior to anterior)      1A-B: Slice containing clip (bisected)      1A: Superior, posterior, lateral margin      1B: Superior, posterior, medial margin      1C-D: Slice containing SAVI scout (bisected)      1C: Superior, posterior, lateral margin      1D: Superior, posterior, medial margin      77F: Superior, anterior, inferior margin      1G: Posterior and lateral margin      1H: Anterior and lateral margin      1I-J Lesion B, entire      1I: Superior margin      1J: Superior, anterior, and medial margin      1K-L: Fibrous tissue      1K: Anterior and inferior margin      1L: Medial and inferior margin      Note: Specimen is received fresh for intraoperative gross margin evaluation.  Collection time: 10/19/2023 at 1130      Time received in lab: 10/19/2023 at 1136      Time in formalin: 10/19/2023 at 1155      Cold ischemia time: 25 minutes 2. "Sentinel lymph node", received fresh and  embedded within a moderate amount of soft, yellow-tan adipose tissue are 6 lymph node candidates that are 0.9-1.8 cm in greatest dimension.Each lymph node serially sectioned and has dark gray, solid cut surfaces.Each lymph node candidate is submitted entirely.      Block summary:      2A-E: 1 lymph node per block      78F-G: 1 lymph node      Collection time: 10/19/2023 at 1138      Time received in lab: 10/19/2023 at 1305      Time in formalin: 10/19/2023 at 1305      Cold ischemia time: 87 minutes 3. Specimen: "Posterior margin"      Size: 1.3 cm anterior-posterior, 2.6 cm superior-inferior, 3.5 cm medial-lateral      Orientation: The specimen is received inked and is sectioned from medial to      lateral.      Anterior: Green      Posterior: Black      Superior: Red      Inferior: Blue      Medial: Yellow      Lateral: Orange      Cut surfaces: 60% dense, white-tan fibrous tissue; 40% soft, lobulated adipose      tissue. There are focal areas of minute hemorrhage occupying up to 5% of the cut      surfaces. Areas of induration, calcifications, and lesions are grossly absent.      Prognostic indicators: Perform on paraffin blocks as needed.      Block summary: The specimen is submitted entirely and sequentially from medial      to lateral.      3A: Medial margin (perpendicular); includes anterior and inferior margins      3B: Medial, anterior, inferior, posterior margin      3C: Medial, anterior, posterior margin      3D: Medial, anterior, inferior, posterior, superior margin      3E-F: Anterior, superior, posterior, inferior margin      3G: Anterior, lateral, posterior, inferior margin      3H: Lateral margin (perpendicular) including anterior margin      Collection time: 10/19/2023 at 1158      Time in formalin: 10/19/2023 at 1208      Cold ischemia time: 10 minutes 4. Specimen: "Right lower breast tissue"      Size: 6.8 x 4.3 x 2.1 cm; 31.4 g (post fixation)      Orientation:  Not provided; presumed deep margin is inked blue, remaining margins      are inked orange      Skin: 6.0 x 3.8 cm portion of tan-brown, mildly wrinkled skin without discrete      scars or lesions.      Cut surfaces: 50% dense, white-pink, faintly lobulated  fibrous tissue; 50%      soft, lobulated adipose tissue. There is a focal area of hemorrhage occupying up      to 5% of the cut surfaces. Areas of induration, calcifications, and lesions are      grossly absent.      Block summary: Representative sections are submitted 8 blocks (4A-H) with the      hemorrhagic area in 53F-H.  Collection time: 10/19/2023 at 1210      Time in formalin: 10/19/2023 at 1305      Cold ischemia time: 55 minutes 5. Specimen: "Right lateral breast tissue"      Size (aggregate of four portions): 7.9 x 5.3 x 2.1 cm; 35.0 g (post fixation)      Orientation: Not provided; not inked      Skin: Absent.      Cut surfaces: 70% dense, white-pink, faintly lobulated  fibrous tissue; 30%      soft, lobulated adipose tissue. Discrete masses and lesions are grossly absent.      Block summary: Representative sections are submitted 4 blocks (5A-D).      Collection time: 10/19/2023 at 1211      Time in formalin: 10/19/2023 at 1305      Cold ischemia time: 54 minutes 6. Specimen: "Inferior left breast tissue"      Size: 3.7 x 2.8 x 1.8 cm; 9.6 g (post fixation)      Orientation: Not provided; inked blue      Skin: Absent.      Cut surfaces: 50% dense, white-pink, faintly lobulated  fibrous tissue; 50%      soft, lobulated adipose tissue. Areas of induration, calcifications, and lesions      are grossly absent.      Block summary: Representative sections are submitted 4 blocks (6A-D).      Collection time: 10/19/2023 at 1213      Time in formalin: 10/19/2023 at 1305      Cold ischemia time: 52 minutes      AMG 10/20/2023        Report signed out from the following location(s) [place] [address], [place] [address] CLIA #: [id number]  [facility] [address] [place], [place] [address] CLIA #: [id number]      RADS: N/A   Assessment:      Assessment Ductal carcinoma in situ (DCIS) of left breast [D05.12] S/p  Plan:      Plan 1. Healing well.  Axillary seroma hopefully will continue to resolve.  Tumor board rec of re-excision noted due to close margins.  Pt leaning towards re-excision at this point, pending discussion with oncology.  Will f/u with plastics as well  UPDATE: Pt willing to proceed at this time. Will schedule   labs/images/medications/previous chart entries reviewed personally and relevant changes/updates noted above.

## 2023-11-29 NOTE — H&P (Signed)
 Subjective:    CC: Ductal carcinoma in situ (DCIS) of left breast [D05.12] POSTOP   HPI:   Subjective Sheena [name] is a 43 y.o. female who is here for followup from above.  Swelling in axilla slowly improving.     Current Medications: has a current medication list which includes the following prescription(s): ergocalciferol (vitamin d2) and multivitamin.   Allergies:  Allergies  No Known Allergies     ROS: General: Denies weight loss, weight gain, fatigue, fevers, chills, and night sweats. Heart: Denies chest pain, palpitations, racing heart, irregular heartbeat, leg pain or swelling, and decreased activity tolerance. Respiratory: Denies breathing difficulty, shortness of breath, wheezing, cough, and sputum. GI: Denies change in appetite, heartburn, nausea, vomiting, constipation, diarrhea, and blood in stool. GU: Denies difficulty urinating, pain with urinating, urgency, frequency, blood in urine     Objective:      Objective BP 110/70   Pulse 79   Ht 165.1 cm (5\' 5" )   Wt 60.8 kg (134 lb)   LMP 10/11/2023   BMI 22.30 kg/m    Constitutional :  Alert, no distress, cooperative  Gastrointestinal: soft, non-tender; bowel sounds normal; no masses,  no organomegaly.   Musculoskeletal: Steady gait and movement  Skin: Cool and moist, incisions clean, dry, intact.  No erythema, induration or drainage to indicate infection.    Psychiatric: Normal affect, non-agitated, not confused         LABS:  Component 13 d ago  SURGICAL PATHOLOGY SURGICAL PATHOLOGY [facility] [address], [address] [place], [place] [address] Telephone [phone] or [phone] Fax [phone]  REPORT OF SURGICAL PATHOLOGY   Accession #: [id number] Patient Name: Sheena, [name] Visit # : [id number]  MRN: [id number] Physician: Sung Amabile DOB/Age Nov 06, 1980 (Age: 10) Gender: F Collected Date: 10/19/2023 Received Date: 10/20/2023  FINAL DIAGNOSIS       1. Breast,  lumpectomy, left mass :      - HIGH-GRADE DUCTAL CARCINOMA IN SITU (DCIS) WITH COMEDO NECROSIS AND      CALCIFICATION AND FOCALLY INVOLVING AN INTRADUCTAL PAPILLOMA.      - SEE CANCER SUMARY BELOW.      - SEPARATE BENIGN SPINDLE CELL PROLIFERATION, SEE NOTE.      - PRIOR BIOPSY SITE CHANGE WITH CLIP.      - SCOUT TAG PRESENT.       2. Lymph node, sentinel, biopsy, left :      - SIX LYMPH NODES NEGATIVE FOR MALIGNANCY (0/6).       3. Breast, partial mastectomy, left, posterior margin :      - FOCAL DUCTAL CARCINOMA IN SITU (DCIS); NEW POSTERIOR MARGIN IS NEGATIVE (0.17      MM TO NEW POSTERIOR MARGIN).      - SCLEROSING ADENOSIS, FIBROADENOMATOID CHANGE, AND USUAL DUCTAL HYPERPLASIA.       4. Breast, partial mastectomy, right lower breast tissue :      - SCLEROSING ADENOSIS AND FIBROADENOMATOID CHANGE.      - NEGATIVE FOR MALIGNANCY.       5. Breast, partial mastectomy, right lateral breast tissue :      - SCLEROSING ADENOSIS AND FIBROADENOMATOID CHANGE.      - NEGATIVE FOR MALIGNANCY.       6. Breast, partial mastectomy, inferior left breast :      - SCLEROSING ADENOSIS AND FIBROADENOMATOID CHANGE.      - NEGATIVE FOR MALIGNANCY.       Diagnosis Note : IHC for myoepithelial  markers p63, calponin, and SMM1 were      performed on the area of DCIS and confirm the above diagnosis.Stain controls      worked appropriately.      In the left lumpectomy specimen (specimen 1) a separate, not previously      biopsied, bland spindle cell lesion was identified. The lesional spindle cells      are positive for SMA and negative for CD34, p63, CKAE1/3, CK903, CK5/6. and      CK8/18. 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 The slides      on this spindle cell lesion underwent intradepartmental consultation and Dr. Reynolds Bowl      concurs with the interpretation.      ELECTRONIC SIGNATURE : Rubinas Md, Delice Bison , Sports administrator, International aid/development worker  MICROSCOPIC DESCRIPTION 1.  CANCER CASE SUMMARY: DUCTAL CARCINOMA IN SITU OF THE BREAST Standard(s): AJCC-UICC 8  SPECIMEN Procedure: Lumpectomy Specimen Laterality: Left  TUMOR Histologic Type: Ductal carcinoma in situ Size (Extent) of DCIS:  Estimated size (extent) of DCIS is at least 13 mm Nuclear Grade: Grade 3 (high) Necrosis: Present, central (expansive comedonecrosis)  MARGINS Margin Status: All margins negative for DCIS Distance from DCIS to closest margin: 0.17 mm Specify closest margin: Posterior  REGIONAL LYMPH NODES Regional Lymph node Status: All regional lymph nodes negative for tumor Total Number of Lymph Nodes Examined (sentinel and non-sentinel): 6 Number of Sentinel Nodes Examined: 6  DISTANT METASTASIS Distant Site(s) Involved, if applicable (select all that apply): Not applicable  PATHOLOGIC STAGE CLASSIFICATION (pTNM, AJCC 8th Edition) TNM Descriptors: Not applicable pTis (DCIS) Regional Lymph Nodes Modifier: Not applicable pN0 pM - Not applicable  SPECIAL STUDIES Breast Biomarker Testing will be performed on block 1C and reported in an addendum. (v4.4.0.0)  CASE COMMENTS STAINS USED IN DIAGNOSIS: H&E H&E H&E H&E H&E H&E H&E H&E H&E H&E H&E H&E (a-SMA) Smooth Muscle Actin CD 34 CK 5/6 CK AE1AE3 CK HMW (903) (Clone [id number]) High Molecular Weight CK8/18 (LMW) P63 Calponin P63 Smooth Muscle Myosin - 1 Heavy Chain Universal Negative Control-DAB CK HMW (903) (Clone [id number]) High Molecular Weight Universal Negative Control-DAB *RECUT 1 SLIDE H&E H&E H&E H&E H&E H&E H&E H&E H&E H&E H&E H&E H&E H&E H&E H&E H&E H&E H&E H&E H&E H&E H&E H&E H&E H&E H&E H&E H&E H&E H&E Stains used in diagnosis 1 ER-ACIS Estrogen receptor (6F11), immunohistochemical stains are performed on formalin fixed, paraffin embedded tissue using a 3,3"-diaminobenzidine (DAB) chromogen and Leica Bond Autostainer System.  The staining intensity of the nucleus  is scored manually and is reported as the percentage of tumor cell nuclei demonstrating specific nuclear staining.Specimens are fixed in 10% Neutral Buffered Formalin for at least 6 hours and up to 72 hours.  These tests have not be validated on decalcified tissue.  Results should be interpreted with caution given the possibility of false negative results on decalcified specimens.  ADDENDUM Breast, lumpectomy, left mass PROGNOSTIC INDICATORS  Results: IMMUNOHISTOCHEMICAL AND MORPHOMETRIC ANALYSIS PERFORMED MANUALLY Estrogen Receptor:  95%, POSITIVE, MODERATE-STRONG STAINING INTENSITY REFERENCE RANGE ESTROGEN RECEPTOR NEGATIVE     0% POSITIVE       =>1% All controls stained appropriately [place] Md, Fred , Sports administrator, International aid/development worker [phone] 05 2025)        Intraoperative Diagnosis: Intraoperative consultation request: Gross margin evaluation (callback)       Intraoperative Diagnosis: Specimen A: "Left breast mass"       Intraoperative Diagnosis: Diagnosis: Biopsy cavity about 2 mm  from posterior (closest) margin       Intraoperative Diagnosis: Time received in lab: 10/19/23 at 1136       Intraoperative Diagnosis: Callback: 10/19/2023 at 1153       Intraoperative Diagnosis: Requesting physician: Dr. Luan Moore       Intraoperative Diagnosis: Rendering pathologist: Dr. M. [place]  CLINICAL HISTORY  SPECIMEN(S) OBTAINED 1. Breast, lumpectomy, Left Mass 2. Lymph node, sentinel, biopsy, Left 3. Breast, partial mastectomy, Left, Posterior Margin 4. Breast, partial mastectomy, Right Lower Breast Tissue 5. Breast, partial mastectomy, Right Lateral Breast Tissue 6. Breast, partial mastectomy, Inferior Left Breast  SPECIMEN COMMENTS: SPECIMEN CLINICAL INFORMATION:    Gross Description 1. Specimen: Breast lumpectomy, "left breast mass"      Size: 4.5 cm anterior-posterior, 4.5 cm superior-inferior, 4.5 cm medial-lateral      Orientation: The specimen is received inked and  is sectioned from anterior to      posterior      Anterior: Green      Posterior: Black      Superior: Red      Inferior: Blue      Medial: Yellow      Lateral: Orange      Localized area: Coil clip; SAVI scout      Lesion A: 2.1 x 1.5 x 1.2 cm; ill-defined, indurated fibrous area with embedded      localization devices      Margins:      Anterior: 0.7 cm      Posterior: 0.2 cm      Superior: 0.5 cm      Inferior: 0.6 cm      Medial: 0.4 cm      Lateral: 1.5 cm      Lesion B: 0.6 x 0.5 x 0.5 cm indurated, predominantly well circumscribed,      white-tan nodule; 0.4 cm from lesion A      Margins:      Anterior: 0.8 cm      Posterior: 3.0 cm      Superior: 0.2 cm      Inferior: 3.5 cm      Medial: 0.7 cm      Lateral: 3.1 cm      Remaining cut surfaces: 40% dense, white-tan fibrous tissue; 60% soft, lobulated      adipose tissue.      Prognostic indicators: Perform on paraffin blocks as needed.      Block summary:      1A-H: Lesion A, entire localized area (sequentially from posterior to anterior)      1A-B: Slice containing clip (bisected)      1A: Superior, posterior, lateral margin      1B: Superior, posterior, medial margin      1C-D: Slice containing SAVI scout (bisected)      1C: Superior, posterior, lateral margin      1D: Superior, posterior, medial margin      77F: Superior, anterior, inferior margin      1G: Posterior and lateral margin      1H: Anterior and lateral margin      1I-J Lesion B, entire      1I: Superior margin      1J: Superior, anterior, and medial margin      1K-L: Fibrous tissue      1K: Anterior and inferior margin      1L: Medial and inferior margin      Note: Specimen is received fresh for intraoperative gross margin evaluation.  Collection time: 10/19/2023 at 1130      Time received in lab: 10/19/2023 at 1136      Time in formalin: 10/19/2023 at 1155      Cold ischemia time: 25 minutes 2. "Sentinel lymph node", received fresh and  embedded within a moderate amount of soft, yellow-tan adipose tissue are 6 lymph node candidates that are 0.9-1.8 cm in greatest dimension.Each lymph node serially sectioned and has dark gray, solid cut surfaces.Each lymph node candidate is submitted entirely.      Block summary:      2A-E: 1 lymph node per block      78F-G: 1 lymph node      Collection time: 10/19/2023 at 1138      Time received in lab: 10/19/2023 at 1305      Time in formalin: 10/19/2023 at 1305      Cold ischemia time: 87 minutes 3. Specimen: "Posterior margin"      Size: 1.3 cm anterior-posterior, 2.6 cm superior-inferior, 3.5 cm medial-lateral      Orientation: The specimen is received inked and is sectioned from medial to      lateral.      Anterior: Green      Posterior: Black      Superior: Red      Inferior: Blue      Medial: Yellow      Lateral: Orange      Cut surfaces: 60% dense, white-tan fibrous tissue; 40% soft, lobulated adipose      tissue. There are focal areas of minute hemorrhage occupying up to 5% of the cut      surfaces. Areas of induration, calcifications, and lesions are grossly absent.      Prognostic indicators: Perform on paraffin blocks as needed.      Block summary: The specimen is submitted entirely and sequentially from medial      to lateral.      3A: Medial margin (perpendicular); includes anterior and inferior margins      3B: Medial, anterior, inferior, posterior margin      3C: Medial, anterior, posterior margin      3D: Medial, anterior, inferior, posterior, superior margin      3E-F: Anterior, superior, posterior, inferior margin      3G: Anterior, lateral, posterior, inferior margin      3H: Lateral margin (perpendicular) including anterior margin      Collection time: 10/19/2023 at 1158      Time in formalin: 10/19/2023 at 1208      Cold ischemia time: 10 minutes 4. Specimen: "Right lower breast tissue"      Size: 6.8 x 4.3 x 2.1 cm; 31.4 g (post fixation)      Orientation:  Not provided; presumed deep margin is inked blue, remaining margins      are inked orange      Skin: 6.0 x 3.8 cm portion of tan-brown, mildly wrinkled skin without discrete      scars or lesions.      Cut surfaces: 50% dense, white-pink, faintly lobulated  fibrous tissue; 50%      soft, lobulated adipose tissue. There is a focal area of hemorrhage occupying up      to 5% of the cut surfaces. Areas of induration, calcifications, and lesions are      grossly absent.      Block summary: Representative sections are submitted 8 blocks (4A-H) with the      hemorrhagic area in 53F-H.  Collection time: 10/19/2023 at 1210      Time in formalin: 10/19/2023 at 1305      Cold ischemia time: 55 minutes 5. Specimen: "Right lateral breast tissue"      Size (aggregate of four portions): 7.9 x 5.3 x 2.1 cm; 35.0 g (post fixation)      Orientation: Not provided; not inked      Skin: Absent.      Cut surfaces: 70% dense, white-pink, faintly lobulated  fibrous tissue; 30%      soft, lobulated adipose tissue. Discrete masses and lesions are grossly absent.      Block summary: Representative sections are submitted 4 blocks (5A-D).      Collection time: 10/19/2023 at 1211      Time in formalin: 10/19/2023 at 1305      Cold ischemia time: 54 minutes 6. Specimen: "Inferior left breast tissue"      Size: 3.7 x 2.8 x 1.8 cm; 9.6 g (post fixation)      Orientation: Not provided; inked blue      Skin: Absent.      Cut surfaces: 50% dense, white-pink, faintly lobulated  fibrous tissue; 50%      soft, lobulated adipose tissue. Areas of induration, calcifications, and lesions      are grossly absent.      Block summary: Representative sections are submitted 4 blocks (6A-D).      Collection time: 10/19/2023 at 1213      Time in formalin: 10/19/2023 at 1305      Cold ischemia time: 52 minutes      AMG 10/20/2023        Report signed out from the following location(s) [place] [address], [place] [address] CLIA #: [id number]  [facility] [address] [place], [place] [address] CLIA #: [id number]      RADS: N/A   Assessment:      Assessment Ductal carcinoma in situ (DCIS) of left breast [D05.12] S/p  Plan:      Plan 1. Healing well.  Axillary seroma hopefully will continue to resolve.  Tumor board rec of re-excision noted due to close margins.  Pt leaning towards re-excision at this point, pending discussion with oncology.  Will f/u with plastics as well  UPDATE: Pt willing to proceed at this time. Will schedule   labs/images/medications/previous chart entries reviewed personally and relevant changes/updates noted above.

## 2023-11-29 NOTE — Patient Instructions (Addendum)
 Your procedure is scheduled on: 12/07/23 - Thursday Report to the Registration Desk on the 1st floor of the [facility]. To find out your arrival time, please call [phone] between 1PM - 3PM on: 12/06/23 - Wednesday If your arrival time is 6:00 am, do not arrive before that time as the [facility] entrance doors do not open until 6:00 am.  REMEMBER: Instructions that are not followed completely may result in serious medical risk, up to and including death; or upon the discretion of your surgeon and anesthesiologist your surgery may need to be rescheduled.  Do not eat food after midnight the night before surgery.  No gum chewing or hard candies.  You may however, drink CLEAR liquids up to 2 hours before you are scheduled to arrive for your surgery. Do not drink anything within 2 hours of your scheduled arrival time.  Clear liquids include: - water  - apple juice without pulp - gatorade (not RED colors) - black coffee or tea (Do NOT add milk or creamers to the coffee or tea) Do NOT drink anything that is not on this list.  One week prior to surgery: Stop Anti-inflammatories (NSAIDS) such as Advil, Aleve, Ibuprofen, Motrin, Naproxen, Naprosyn and Aspirin based products such as Excedrin, Goody's Powder, BC Powder. You may take Tylenol if needed for pain up until the day of surgery.  Stop ANY OVER THE COUNTER supplements until after surgery.   ON THE DAY OF SURGERY ONLY TAKE THESE MEDICATIONS WITH SIPS OF WATER:  None   No Alcohol for 24 hours before or after surgery.  No Smoking including e-cigarettes for 24 hours before surgery.  No chewable tobacco products for at least 6 hours before surgery.  No nicotine patches on the day of surgery.  Do not use any "recreational" drugs for at least a week (preferably 2 weeks) before your surgery.  Please be advised that the combination of cocaine and anesthesia may have negative outcomes, up to and including death. If you test  positive for cocaine, your surgery will be cancelled.  On the morning of surgery brush your teeth with toothpaste and water, you may rinse your mouth with mouthwash if you wish. Do not swallow any toothpaste or mouthwash.  Use CHG Soap or wipes as directed on instruction sheet.  Do not wear jewelry, make-up, hairpins, clips or nail polish.  For welded (permanent) jewelry: bracelets, anklets, waist bands, etc.  Please have this removed prior to surgery.  If it is not removed, there is a chance that hospital personnel will need to cut it off on the day of surgery.  Do not wear lotions, powders, or perfumes.   Do not shave body hair from the neck down 48 hours before surgery.  Contact lenses, hearing aids and dentures may not be worn into surgery.  Do not bring valuables to the hospital. [facility] is not responsible for any missing/lost belongings or valuables.   Notify your doctor if there is any change in your medical condition (cold, fever, infection).  Wear comfortable clothing (specific to your surgery type) to the hospital.  After surgery, you can help prevent lung complications by doing breathing exercises.  Take deep breaths and cough every 1-2 hours. Your doctor may order a device called an Incentive Spirometer to help you take deep breaths. When coughing or sneezing, hold a pillow firmly against your incision with both hands. This is called "splinting." Doing this helps protect your incision. It also decreases belly discomfort.  If  you are being admitted to the hospital overnight, leave your suitcase in the car. After surgery it may be brought to your room.  In case of increased patient census, it may be necessary for you, the patient, to continue your postoperative care in the Same Day Surgery department.  If you are being discharged the day of surgery, you will not be allowed to drive home. You will need a responsible individual to drive you home and stay with you for 24  hours after surgery.   If you are taking public transportation, you will need to have a responsible individual with you.  Please call the Pre-admissions Testing Dept. at [phone] if you have any questions about these instructions.  Surgery Visitation Policy:  Patients having surgery or a procedure may have two visitors.  Children under the age of 54 must have an adult with them who is not the patient.  Inpatient Visitation:    Visiting hours are 7 a.m. to 8 p.m. Up to four visitors are allowed at one time in a patient room. The visitors may rotate out with other people during the day.  One visitor age 86 or older may stay with the patient overnight and must be in the room by 8 p.m.     Preparing for Surgery with CHLORHEXIDINE GLUCONATE (CHG) Soap  Chlorhexidine Gluconate (CHG) Soap  o An antiseptic cleaner that kills germs and bonds with the skin to continue killing germs even after washing  o Used for showering the night before surgery and morning of surgery  Before surgery, you can play an important role by reducing the number of germs on your skin.  CHG (Chlorhexidine gluconate) soap is an antiseptic cleanser which kills germs and bonds with the skin to continue killing germs even after washing.  Please do not use if you have an allergy to CHG or antibacterial soaps. If your skin becomes reddened/irritated stop using the CHG.  1. Shower the NIGHT BEFORE SURGERY and the MORNING OF SURGERY with CHG soap.  2. If you choose to wash your hair, wash your hair first as usual with your normal shampoo.  3. After shampooing, rinse your hair and body thoroughly to remove the shampoo.  4. Use CHG as you would any other liquid soap. You can apply CHG directly to the skin and wash gently with a scrungie or a clean washcloth.  5. Apply the CHG soap to your body only from the neck down. Do not use on open wounds or open sores. Avoid contact with your eyes, ears, mouth, and genitals  (private parts). Wash face and genitals (private parts) with your normal soap.  6. Wash thoroughly, paying special attention to the area where your surgery will be performed.  7. Thoroughly rinse your body with warm water.  8. Do not shower/wash with your normal soap after using and rinsing off the CHG soap.  9. Pat yourself dry with a clean towel.  10. Wear clean pajamas to bed the night before surgery.  12. Place clean sheets on your bed the night of your first shower and do not sleep with pets.  13. Shower again with the CHG soap on the day of surgery prior to arriving at the hospital.  14. Do not apply any deodorants/lotions/powders.  15. Please wear clean clothes to the hospital.

## 2023-12-01 ENCOUNTER — Encounter: Payer: Self-pay | Admitting: *Deleted

## 2023-12-01 NOTE — Telephone Encounter (Signed)
 Patient called and stated that she was concerned she had cording to the left breast.  She states that she has striations/a little bit of firmness that are noticeable when she pulls the breast medially.  She states that they are not noticeable when she does not manipulate the breast.  She denies any pain.  She denies any other issues or concerns.  I offered the patient an appointment today to see if this could be cording.  I discussed with her that if it truly is cording, she may need to be placed on low-dose aspirin.  Patient states that she was unable to make it into today's appointment.  She states though that she can come in on Monday morning.  I recommended that she continue to closely monitor the area and apply warm compresses 1-2 times daily.  Patient expressed understanding.  Patient also states that she has surgery with general surgery on Thursday.  Instructed the patient to call in the meantime if she has any other questions or concerns.

## 2023-12-01 NOTE — Progress Notes (Signed)
 Sheena [name] has some possible cording in her arm and breast.   She is seeing the plastic surgeon on Monday.    Appt. Moved up with [name], OT to Wed. 4/16.   She is scheduled for re-excision on 4/17.

## 2023-12-04 ENCOUNTER — Encounter: Admitting: Surgical

## 2023-12-06 ENCOUNTER — Ambulatory Visit: Attending: Oncology | Admitting: Occupational Therapy

## 2023-12-06 ENCOUNTER — Encounter: Payer: Self-pay | Admitting: Occupational Therapy

## 2023-12-06 DIAGNOSIS — M25612 Stiffness of left shoulder, not elsewhere classified: Secondary | ICD-10-CM | POA: Insufficient documentation

## 2023-12-06 DIAGNOSIS — L905 Scar conditions and fibrosis of skin: Secondary | ICD-10-CM | POA: Insufficient documentation

## 2023-12-06 NOTE — Therapy (Signed)
 OUTPATIENT OCCUPATIONAL THERAPY BREAST CANCER POSTOP EVALUATION   Patient Name: Sheena [name] MRN: [id number] DOB:[birth year], 43 y.o., female Today's Date: 12/06/2023  END OF SESSION:  OT End of Session - 12/06/23 1625     Visit Number 1    Number of Visits 4    Date for OT Re-Evaluation 02/28/24    OT Start Time 1330    OT Stop Time 1358    OT Time Calculation (min) 28 min    Activity Tolerance Patient tolerated treatment well    Behavior During Therapy [facility] for tasks assessed/performed             Past Medical History:  Diagnosis Date   Anemia    Ductal carcinoma in situ (DCIS) of left breast 2025   Herpes, genital    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, hpv+   Past Surgical History:  Procedure Laterality Date   BREAST BIOPSY Left 08/31/2023   left breast stereo calcs, coil clip, path pending   BREAST BIOPSY Left 08/31/2023   MM LT BREAST BX W LOC DEV 1ST LESION IMAGE BX SPEC STEREO GUIDE 08/31/2023 [facility]   BREAST REDUCTION SURGERY Bilateral 10/19/2023   Procedure: BILATERAL ONCOPLASTIC BREAST REDUCTION;  Surgeon: Thornell Flirt, DO;  Location: [facility] OR[facility];  Service: Plastics;  Laterality: Bilateral;   CESAREAN SECTION  2005; 2015   [facility] x2   CESAREAN SECTION     PART MASTECTOMY,RADIO FREQUENCY LOCALIZER,AXILLARY SENTINEL NODE BIOPSY Left 10/19/2023   Procedure: PART MASTECTOMY,RADIO FREQUENCY LOCALIZER,AXILLARY SENTINEL NODE BIOPSY;  Surgeon: Conrado Delay, DO;  Location: [facility] OR[facility];  Service: General;  Laterality: Left;   Patient Active Problem List   Diagnosis Date Noted   Genetic testing 09/25/2023   Family history of malignant neoplasm of prostate 09/11/2023   Ductal carcinoma in situ (DCIS) of left breast 09/06/2023   Family history of breast cancer 09/06/2023    PCP: Thalia Filler NP  REFERRING PROVIDER: Dr Arden Kotyk DIAG: Scar tissue with lymphatic cording left and shoulder stiffness  THERAPY DIAG:  Stiffness of left shoulder, not elsewhere  classified  Scar condition and fibrosis of skin  Rationale for Evaluation and Treatment: Rehabilitation  ONSET DATE: 10/19/23  SUBJECTIVE:                                                                                                                                                                                           SUBJECTIVE STATEMENT: Patient reports she had 10/19/2023 left lumpectomy by Dr. Rosea Conch.  With a partial breast reduction on bilateral breast by Dr. Orin Birk.  Patient reports she have some in crease lymphatic cording  in her L  arm.  And having tomorrow a reexcision by Dr. Tonna Boehringer PERTINENT HISTORY:  Patient had 10/19/2023 left lumpectomy by Dr. Tonna Boehringer.  Was a breast reduction by Dr. Ulice Bold on bilateral breasts.  Patient is scheduled for tomorrow for a reexcision of the left breast by Dr. Tonna Boehringer.  Patient present at OT evaluation today with reports of lymphatic cording in left upper extremity with overhead range of motion as well as into the breast.  PATIENT GOALS:   reduce lymphedema risk and learn post op HEP.   PAIN:  Are you having pain?  1-2/10 in left upper arm to elbow with overhead shoulder range of motion  PRECAUTIONS: Active CA     HAND DOMINANCE: right  WEIGHT BEARING RESTRICTIONS: No  FALLS:  Has patient fallen in last 6 months? No  LIVING ENVIRONMENT: Patient lives with: With family.  OCCUPATION: She works in Scientific laboratory technician: Patient reports she likes to workout but at home with her weights in on mat.  Spent time with 1 year old son   OBJECTIVE:  COGNITION: Overall cognitive status: Within functional limits for tasks assessed    POSTURE:  Forward head and rounded shoulders posture  UPPER EXTREMITY AROM/PROM: Bilateral shoulder active range of motion within normal limits overhead.  But patient showed lymphatic cording in left upper arm to volar elbow but very minimal.  Patient reports more feeling at overhead. As well as in the  lateral breast between lumpectomy scar and nipple.  A/PROM RIGHT   eval   Shoulder extension   Shoulder flexion   Shoulder abduction   Shoulder internal rotation   Shoulder external rotation     (Blank rows = not tested)  A/PROM LEFT   eval  Shoulder extension   Shoulder flexion   Shoulder abduction   Shoulder internal rotation   Shoulder external rotation     (Blank rows = not tested)  CERVICAL AROM: All within normal limits:    Percent limited  Flexion   Extension   Right lateral flexion   Left lateral flexion   Right rotation   Left rotation     UPPER EXTREMITY STRENGTH: Bilateral shoulder strength 5/5.  Patient reports she has been doing some 10 pound weights.-Remind patient again after reexcision tomorrow needs to hold off and only do range of motion per surgeon instructions  LYMPHEDEMA ASSESSMENTS:   LANDMARK RIGHT   eval  10 cm proximal to olecranon process  15 cm 28.5  Olecranon process 22.8  10 cm proximal to ulnar styloid process   Just proximal to ulnar styloid process   Across hand at thumb web space   At base of 2nd digit   (Blank rows = not tested)  LANDMARK LEFT   eval  10 cm proximal to olecranon process 15 cm 28 cm   Olecranon process 23  10 cm proximal to ulnar styloid process   Just proximal to ulnar styloid process   Across hand at thumb web space   At base of 2nd digit   (Blank rows = not tested)  L-DEX LYMPHEDEMA SCREENING:  The patient was assessed using the L-Dex machine today to produce a lymphedema index baseline score. The patient will be reassessed on a regular basis (typically every 3 months) to obtain new L-Dex scores. If the score is > 6.5 points away from his/her baseline score indicating onset of subclinical lymphedema, it will be recommended to wear a compression garment for 4 weeks, 12 hours per day and then be reassessed.  If the score continues to be > 6.5 points from baseline at reassessment, we will initiate lymphedema  treatment. Assessing in this manner has a 95% rate of preventing clinically significant lymphedema.   L-DEX FLOWSHEETS - 12/06/23 1600       L-DEX LYMPHEDEMA SCREENING   Measurement Type Unilateral    L-DEX MEASUREMENT EXTREMITY Upper Extremity    POSITION  Standing    DOMINANT SIDE Right    At Risk Side Left    L-DEX SCORE (UNILATERAL) 13.2            Patient with abnormal score in pink.  But could be because of reports of lymphatic cording and irritation in the left upper arm and breast since surgery per patient.   Assess patient's range of motion as well as lateral breast and axilla.  Do not see visible cording but patient report feeling of cording in volar upper arm and elbow. Add for patient child's pose stretches with upper extremities into forward flexion and can also do lateral slides to the right to increase stretch. To keep stretch under 1/-2/10.  Patient reports improvement in stretch or pull report. Patient can do today 2-3 times but then hold off and only do active assisted range of motion to 90 degrees for shoulder abduction and flexion external rotation in supine 10 reps pain-free check with surgeon first after reexcision tomorrow. Patient can also do in supine gentle traction on lumpectomy scar prior to tomorrow with hip rotation to the right with shoulder abduction to the left. And can go overhead after surgeon okays it.  Or follow-up with me 2 to 3 weeks postop.  PATIENT EDUCATION:  Education details: Lymphedema risk reduction and post op shoulder/posture HEP Person educated: Patient Education method: Explanation, Demonstration, Handout Education comprehension: Patient verbalized understanding and returned demonstration  Home exercise for patient to do active assisted range of motion in supine to 90 degrees for shoulder flexion and abduction and external rotation.  Keeping it pain-free if okayed by surgeon after tomorrow surgery.  He can go overhead when okay by  Careers adviser.  ASSESSMENT:  CLINICAL IMPRESSION: Patient present at OT evaluation about 7 weeks postop from left lumpectomy with bilateral breast reduction.  Patient have a reexcision by Dr. Rosea Conch tomorrow in the left breast.  Patient present today with decreased left shoulder abduction overhead with reports of lymphatic cording in upper arm and elbow.  As well as lateral breast.  Patient was provided with overhead stretches for child pose.  With great success.  Remind patient after surgery tomorrow to keep it pain-free to 90 degrees with shoulder abduction and flexion external rotation following surgeons instructions.  And can follow-up with me 2 to 3 weeks postop.  Patient has a follow-up in about a month with her Engineer, petroleum.  She will benefit from a post op OT reassessment to determine needs and from L-Dex screens and after every 3 months for 2 years to detect subclinical lymphedema.  Patient L-Dex score was abnormal today but patient have reports of lymphatic cording that can influence numbers.  Pt will benefit from skilled therapeutic intervention to improve on the following deficits: Decreased knowledge of precautions and lymphedema education, impaired UE functional use, pain, decreased ROM, postural dysfunction.   OT treatment/interventions: ADL/self-care home management, pt/family education, therapeutic exercise,manual therapy  REHAB POTENTIAL: Good  CLINICAL DECISION MAKING: Stable/uncomplicated  EVALUATION COMPLEXITY: Low   GOALS: Goals reviewed with patient? YES  LONG TERM GOALS: (STG=LTG)    Name Target Date Goal  status  1 Pt will be able to verbalize understanding of pertinent lymphedema risk reduction practices relevant to her dx specifically related to skin care.  Baseline:  No knowledge 12 wks Initial  2 Pt will be able to return demo and/or verbalize understanding of the post op HEP related to regaining shoulder ROM. Baseline:  No knowledge Met today Achieved at eval        4 Pt will demo she has regained full shoulder ROM and function post operatively compared to baselines.  Baseline: See objective measurements taken today. 12 wks Initial    PLAN:  OT FREQUENCY/DURATION: EVAL and 4  follow up appointments for 12 wks   PLAN FOR NEXT SESSION: will reassess  2-3  weeks post op to determine needs.   Patient will follow up at outpatient cancer rehab 2-3 weeks following surgery.  If the patient requires occupational therapy at that time, a specific plan will be dictated and sent to the referring physician for approval. T Occupational Therapy Information for After Breast Cancer Surgery/Treatment:  Lymphedema is a swelling condition that you may be at risk for in your arm if you have lymph nodes removed from the armpit area.  After a sentinel node biopsy, the risk is approximately 5-9% and is higher after an axillary node dissection.  There is treatment available for this condition and it is not life-threatening.  Contact your physician or occupational therapist with concerns. You may begin the 4 shoulder/posture exercises (see additional sheet) when permitted by your physician (typically a week after surgery).  If you have drains, you may need to wait until those are removed before beginning range of motion exercises.  A general recommendation is to not lift your arms above shoulder height until drains are removed.  These exercises should be done to your tolerance and gently.  This is not a "no pain/no gain" type of recovery so listen to your body and stretch into the range of motion that you can tolerate, stopping if you have pain.  If you are having immediate reconstruction, ask your plastic surgeon about doing exercises as he or she may want you to wait. .  While undergoing any medical procedure or treatment, try to avoid blood pressure being taken or needle sticks from occurring on the arm on the side of cancer.   This recommendation begins after surgery and continues  for the rest of your life.  This may help reduce your risk of getting lymphedema (swelling in your arm). An excellent resource for those seeking information on lymphedema is the National Lymphedema Network's web site. It can be accessed at www.lymphnet.org If you notice swelling in your hand, arm or breast at any time following surgery (even if it is many years from now), please contact your doctor or occupational therapist to discuss this.  Lymphedema can be treated at any time but it is easier for you if it is treated early on.  If you feel like your shoulder motion is not returning to normal in a reasonable amount of time, please contact your surgeon or occupational therapist.  [facility] [phone]. [address] [place], [place] [address]  Ivan Marion, OTR/L,CLT 12/06/2023, 4:28 PM

## 2023-12-07 ENCOUNTER — Ambulatory Visit

## 2023-12-07 ENCOUNTER — Encounter: Payer: Self-pay | Admitting: Surgery

## 2023-12-07 ENCOUNTER — Other Ambulatory Visit: Payer: Self-pay

## 2023-12-07 ENCOUNTER — Ambulatory Visit
Admission: RE | Admit: 2023-12-07 | Discharge: 2023-12-07 | Disposition: A | Discharge status: Home / Self Care | Attending: Surgery | Admitting: Surgery

## 2023-12-07 ENCOUNTER — Encounter
Admission: RE | Disposition: A | Discharge status: Home / Self Care | Payer: Self-pay | Source: Home / Self Care | Attending: Surgery

## 2023-12-07 DIAGNOSIS — D0512 Intraductal carcinoma in situ of left breast: Secondary | ICD-10-CM | POA: Diagnosis present

## 2023-12-07 DIAGNOSIS — Z01812 Encounter for preprocedural laboratory examination: Secondary | ICD-10-CM

## 2023-12-07 HISTORY — PX: BREAST LUMPECTOMY: SHX2

## 2023-12-07 LAB — POCT PREGNANCY, URINE: Preg Test, Ur: NEGATIVE

## 2023-12-07 SURGERY — BREAST LUMPECTOMY
Anesthesia: General | Site: Breast | Laterality: Left

## 2023-12-07 MED ORDER — IBUPROFEN 800 MG PO TABS: 800.0000 mg | ORAL_TABLET | Freq: Three times a day (TID) | ORAL | 0 refills | Status: AC | PRN

## 2023-12-07 MED ORDER — ONDANSETRON HCL 4 MG/2ML IJ SOLN
INTRAMUSCULAR | Status: AC
  Filled 2023-12-07: qty 2

## 2023-12-07 MED ORDER — FENTANYL CITRATE (PF) 100 MCG/2ML IJ SOLN
INTRAMUSCULAR | Status: AC
Start: 2023-12-07 — End: ?
  Filled 2023-12-07: qty 2

## 2023-12-07 MED ORDER — PROPOFOL 10 MG/ML IV BOLUS
INTRAVENOUS | Status: DC | PRN
Start: 2023-12-07 — End: 2023-12-07
  Administered 2023-12-07: 100 mg via INTRAVENOUS
  Administered 2023-12-07: 50 mg via INTRAVENOUS

## 2023-12-07 MED ORDER — ORAL CARE MOUTH RINSE: 15.0000 mL | Freq: Once | OROMUCOSAL | Status: AC

## 2023-12-07 MED ORDER — LACTATED RINGERS IV SOLN
INTRAVENOUS | Status: DC
Start: 2023-12-07 — End: 2023-12-07

## 2023-12-07 MED ORDER — FENTANYL CITRATE (PF) 100 MCG/2ML IJ SOLN
INTRAMUSCULAR | Status: AC
  Filled 2023-12-07: qty 2

## 2023-12-07 MED ORDER — LIDOCAINE HCL (PF) 2 % IJ SOLN
INTRAMUSCULAR | Status: AC
  Filled 2023-12-07: qty 5

## 2023-12-07 MED ORDER — ACETAMINOPHEN ER 650 MG PO TBCR: 650.0000 mg | EXTENDED_RELEASE_TABLET | Freq: Three times a day (TID) | ORAL | 0 refills | Status: AC | PRN

## 2023-12-07 MED ORDER — ONDANSETRON HCL 4 MG/2ML IJ SOLN
INTRAMUSCULAR | Status: DC | PRN
  Administered 2023-12-07: 4 mg via INTRAVENOUS

## 2023-12-07 MED ORDER — CHLORHEXIDINE GLUCONATE 0.12 % MT SOLN
OROMUCOSAL | Status: AC
  Filled 2023-12-07: qty 15

## 2023-12-07 MED ORDER — CHLORHEXIDINE GLUCONATE 0.12 % MT SOLN
15.0000 mL | Freq: Once | OROMUCOSAL | Status: AC
  Administered 2023-12-07: 15 mL via OROMUCOSAL

## 2023-12-07 MED ORDER — PHENYLEPHRINE 80 MCG/ML (10ML) SYRINGE FOR IV PUSH (FOR BLOOD PRESSURE SUPPORT)
PREFILLED_SYRINGE | INTRAVENOUS | Status: DC | PRN
  Administered 2023-12-07 (×3): 80 ug via INTRAVENOUS

## 2023-12-07 MED ORDER — OXYCODONE-ACETAMINOPHEN 5-325 MG PO TABS: 1.0000 | ORAL_TABLET | Freq: Three times a day (TID) | ORAL | 0 refills | Status: DC | PRN

## 2023-12-07 MED ORDER — MIDAZOLAM HCL 2 MG/2ML IJ SOLN
INTRAMUSCULAR | Status: DC | PRN
  Administered 2023-12-07: 2 mg via INTRAVENOUS

## 2023-12-07 MED ORDER — BUPIVACAINE-EPINEPHRINE (PF) 0.5% -1:200000 IJ SOLN
INTRAMUSCULAR | Status: AC
  Filled 2023-12-07: qty 30

## 2023-12-07 MED ORDER — LIDOCAINE HCL (CARDIAC) PF 100 MG/5ML IV SOSY
PREFILLED_SYRINGE | INTRAVENOUS | Status: DC | PRN
  Administered 2023-12-07: 100 mg via INTRAVENOUS

## 2023-12-07 MED ORDER — FENTANYL CITRATE (PF) 100 MCG/2ML IJ SOLN
INTRAMUSCULAR | Status: DC | PRN
  Administered 2023-12-07: 100 ug via INTRAVENOUS

## 2023-12-07 MED ORDER — CEFAZOLIN SODIUM-DEXTROSE 2-4 GM/100ML-% IV SOLN
INTRAVENOUS | Status: AC
  Filled 2023-12-07: qty 100

## 2023-12-07 MED ORDER — ACETAMINOPHEN 10 MG/ML IV SOLN
INTRAVENOUS | Status: DC | PRN
  Administered 2023-12-07: 1000 mg via INTRAVENOUS

## 2023-12-07 MED ORDER — DOCUSATE SODIUM 100 MG PO CAPS: 100.0000 mg | ORAL_CAPSULE | Freq: Two times a day (BID) | ORAL | 0 refills | Status: AC | PRN

## 2023-12-07 MED ORDER — DEXAMETHASONE SODIUM PHOSPHATE 10 MG/ML IJ SOLN
INTRAMUSCULAR | Status: AC
  Filled 2023-12-07: qty 1

## 2023-12-07 MED ORDER — MIDAZOLAM HCL 2 MG/2ML IJ SOLN
INTRAMUSCULAR | Status: AC
  Filled 2023-12-07: qty 2

## 2023-12-07 MED ORDER — LIDOCAINE HCL (PF) 1 % IJ SOLN
INTRAMUSCULAR | Status: AC
  Filled 2023-12-07: qty 30

## 2023-12-07 MED ORDER — PROPOFOL 10 MG/ML IV BOLUS
INTRAVENOUS | Status: AC
  Filled 2023-12-07: qty 20

## 2023-12-07 MED ORDER — CEFAZOLIN SODIUM-DEXTROSE 2-4 GM/100ML-% IV SOLN
2.0000 g | INTRAVENOUS | Status: AC
  Administered 2023-12-07: 2 g via INTRAVENOUS

## 2023-12-07 MED ORDER — FENTANYL CITRATE (PF) 100 MCG/2ML IJ SOLN: 25.0000 ug | INTRAMUSCULAR | Status: DC | PRN

## 2023-12-07 MED ORDER — KETOROLAC TROMETHAMINE 30 MG/ML IJ SOLN
INTRAMUSCULAR | Status: AC
  Filled 2023-12-07: qty 1

## 2023-12-07 MED ORDER — LIDOCAINE HCL 1 % IJ SOLN
INTRAMUSCULAR | Status: DC | PRN
  Administered 2023-12-07: 15 mL via SURGICAL_CAVITY

## 2023-12-07 MED ORDER — ACETAMINOPHEN 10 MG/ML IV SOLN
INTRAVENOUS | Status: AC
  Filled 2023-12-07: qty 100

## 2023-12-07 MED ORDER — OXYCODONE HCL 5 MG/5ML PO SOLN: 5.0000 mg | Freq: Once | ORAL | Status: DC | PRN

## 2023-12-07 MED ORDER — DEXAMETHASONE SODIUM PHOSPHATE 10 MG/ML IJ SOLN
INTRAMUSCULAR | Status: DC | PRN
  Administered 2023-12-07: 5 mg via INTRAVENOUS

## 2023-12-07 MED ORDER — KETOROLAC TROMETHAMINE 30 MG/ML IJ SOLN
INTRAMUSCULAR | Status: DC | PRN
Start: 2023-12-07 — End: 2023-12-07
  Administered 2023-12-07: 15 mg via INTRAVENOUS

## 2023-12-07 MED ORDER — ONDANSETRON HCL 4 MG/2ML IJ SOLN: 4.0000 mg | Freq: Once | INTRAMUSCULAR | Status: DC | PRN

## 2023-12-07 MED ORDER — OXYCODONE HCL 5 MG PO TABS: 5.0000 mg | ORAL_TABLET | Freq: Once | ORAL | Status: DC | PRN

## 2023-12-07 MED ORDER — CHLORHEXIDINE GLUCONATE CLOTH 2 % EX PADS: 6.0000 | MEDICATED_PAD | Freq: Once | CUTANEOUS | Status: DC

## 2023-12-07 SURGICAL SUPPLY — 30 items
BLADE PHOTON ILLUMINATED (MISCELLANEOUS) ×1 IMPLANT
BLADE SURG 15 STRL LF DISP TIS (BLADE) ×1 IMPLANT
CHLORAPREP W/TINT 26 (MISCELLANEOUS) IMPLANT
DERMABOND ADVANCED .7 DNX12 (GAUZE/BANDAGES/DRESSINGS) ×1 IMPLANT
DEVICE DUBIN SPECIMEN MAMMOGRA (MISCELLANEOUS) ×1 IMPLANT
DRAPE LAPAROTOMY 77X122 PED (DRAPES) ×1 IMPLANT
ELECT REM PT RETURN 9FT ADLT (ELECTROSURGICAL) ×1 IMPLANT
ELECTRODE REM PT RTRN 9FT ADLT (ELECTROSURGICAL) ×1 IMPLANT
GLOVE BIOGEL PI IND STRL 7.0 (GLOVE) ×1 IMPLANT
GLOVE SURG SYN 6.5 ES PF (GLOVE) ×1 IMPLANT
GLOVE SURG SYN 6.5 PF PI (GLOVE) ×1 IMPLANT
GOWN STRL REUS W/ TWL LRG LVL3 (GOWN DISPOSABLE) ×2 IMPLANT
KIT MARKER MARGIN INK (KITS) ×1 IMPLANT
KIT TURNOVER KIT A (KITS) ×1 IMPLANT
LABEL OR SOLS (LABEL) ×1 IMPLANT
LIGHT WAVEGUIDE WIDE FLAT (MISCELLANEOUS) IMPLANT
MANIFOLD NEPTUNE II (INSTRUMENTS) ×1 IMPLANT
MARKER MARGIN CORRECT CLIP (MARKER) ×1 IMPLANT
NDL HYPO 22X1.5 SAFETY MO (MISCELLANEOUS) ×1 IMPLANT
NEEDLE HYPO 22X1.5 SAFETY MO (MISCELLANEOUS) ×1 IMPLANT
PACK BASIN MINOR ARMC (MISCELLANEOUS) ×1 IMPLANT
SUT MNCRL 4-0 27 PS-2 XMFL (SUTURE) ×1 IMPLANT
SUT SILK 3 0 12 30 (SUTURE) IMPLANT
SUT VIC AB 3-0 SH 27X BRD (SUTURE) ×1 IMPLANT
SUTURE MNCRL 4-0 27XMF (SUTURE) ×1 IMPLANT
SYR 20ML LL LF (SYRINGE) ×1 IMPLANT
SYR BULB IRRIG 60ML STRL (SYRINGE) ×1 IMPLANT
TRAP FLUID SMOKE EVACUATOR (MISCELLANEOUS) ×1 IMPLANT
TRAP NEPTUNE SPECIMEN COLLECT (MISCELLANEOUS) ×1 IMPLANT
WATER STERILE IRR 500ML POUR (IV SOLUTION) ×1 IMPLANT

## 2023-12-07 NOTE — Anesthesia Preprocedure Evaluation (Signed)
 Anesthesia Evaluation  Patient identified by MRN, date of birth, ID band Patient awake    Reviewed: Allergy & Precautions, NPO status , Patient's Chart, lab work & pertinent test results  Airway Mallampati: II  TM Distance: >3 FB Neck ROM: full    Dental  (+) Teeth Intact   Pulmonary neg pulmonary ROS   Pulmonary exam normal breath sounds clear to auscultation       Cardiovascular Exercise Tolerance: Good negative cardio ROS Normal cardiovascular exam Rhythm:Regular Rate:Normal     Neuro/Psych negative neurological ROS  negative psych ROS   GI/Hepatic negative GI ROS, Neg liver ROS,,,  Endo/Other  negative endocrine ROS    Renal/GU negative Renal ROS     Musculoskeletal   Abdominal Normal abdominal exam  (+)   Peds  Hematology negative hematology ROS (+)   Anesthesia Other Findings Past Medical History: No date: Anemia 2025: Ductal carcinoma in situ (DCIS) of left breast No date: Herpes, genital 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, hpv+  Past Surgical History: 08/31/2023: BREAST BIOPSY; Left     Comment:  left breast stereo calcs, coil clip, path pending 08/31/2023: BREAST BIOPSY; Left     Comment:  MM LT BREAST BX W LOC DEV 1ST LESION IMAGE BX SPEC               STEREO GUIDE 08/31/2023 [facility] 10/19/2023: BREAST REDUCTION SURGERY; Bilateral     Comment:  Procedure: BILATERAL ONCOPLASTIC BREAST REDUCTION;                Surgeon: Peggye Form, DO;  Location: [facility] OR[facility];                Service: Plastics;  Laterality: Bilateral; 2005; 2015: CESAREAN SECTION     Comment:  [facility] x2 No date: CESAREAN SECTION 10/19/2023: PART MASTECTOMY,RADIO FREQUENCY LOCALIZER,AXILLARY  SENTINEL NODE BIOPSY; Left     Comment:  Procedure: PART MASTECTOMY,RADIO FREQUENCY               LOCALIZER,AXILLARY SENTINEL NODE BIOPSY;  Surgeon: Sung Amabile, DO;  Location: [facility] ORS;  Service:  General;                Laterality: Left;     Reproductive/Obstetrics negative OB ROS                             Anesthesia Physical Anesthesia Plan  ASA: 1  Anesthesia Plan: General   Post-op Pain Management:    Induction: Intravenous  PONV Risk Score and Plan: Dexamethasone, Ondansetron, Midazolam and Treatment may vary due to age or medical condition  Airway Management Planned: LMA  Additional Equipment:   Intra-op Plan:   Post-operative Plan: Extubation in OR  Informed Consent: I have reviewed the patients History and Physical, chart, labs and discussed the procedure including the risks, benefits and alternatives for the proposed anesthesia with the patient or authorized representative who has indicated his/her understanding and acceptance.     Dental Advisory Given  Plan Discussed with: CRNA  Anesthesia Plan Comments:        Anesthesia Quick Evaluation

## 2023-12-07 NOTE — Anesthesia Procedure Notes (Signed)
 Procedure Name: LMA Insertion Date/Time: 12/07/2023 11:50 AM  Performed by: Sherrlyn Dolores, CRNAPre-anesthesia Checklist: Patient identified, Patient being monitored, Timeout performed, Emergency Drugs available and Suction available Patient Re-evaluated:Patient Re-evaluated prior to induction Oxygen Delivery Method: Circle system utilized Preoxygenation: Pre-oxygenation with 100% oxygen Induction Type: IV induction Ventilation: Mask ventilation without difficulty LMA: LMA inserted LMA Size: 4.0 Tube type: Oral Number of attempts: 1 Placement Confirmation: positive ETCO2 and breath sounds checked- equal and bilateral ETT to lip (cm): Pink tape. Tube secured with: Tape Dental Injury: Teeth and Oropharynx as per pre-operative assessment  Comments: LMA x1 attempt. Atraumatic. Dentition unchanged from preop baseline.

## 2023-12-07 NOTE — Transfer of Care (Signed)
 Immediate Anesthesia Transfer of Care Note  Patient: Sheena [name]  Procedure(s) Performed: BREAST LUMPECTOMY (Left: Breast)  Patient Location: PACU  Anesthesia Type:General  Level of Consciousness: awake, alert , oriented, and patient cooperative  Airway & Oxygen Therapy: Patient Spontanous Breathing and Patient connected to face mask oxygen  Post-op Assessment: Report given to RN and Post -op Vital signs reviewed and stable  Post vital signs: Reviewed and stable  Last Vitals:  Vitals Value Taken Time  BP 103/61 12/07/23 1301  Temp 36.3 C 12/07/23 1300  Pulse 68 12/07/23 1308  Resp 14 12/07/23 1308  SpO2 100 % 12/07/23 1308  Vitals shown include unfiled device data.  Last Pain:  Vitals:   12/07/23 1112  TempSrc: Temporal  PainSc: 0-No pain       Pt transported to PACU with circ RN and report given to PACU RN. Patent airway, breathing spontaneously on 6L O2 via FM. Pt awake, responsive and comfortable. VSS.   Complications: No notable events documented.

## 2023-12-07 NOTE — Anesthesia Postprocedure Evaluation (Signed)
 Anesthesia Post Note  Patient: Sheena [name]  Procedure(s) Performed: BREAST LUMPECTOMY (Left: Breast)  Patient location during evaluation: PACU Anesthesia Type: General Level of consciousness: sedated Pain management: pain level controlled Vital Signs Assessment: post-procedure vital signs reviewed and stable Respiratory status: spontaneous breathing and nonlabored ventilation Cardiovascular status: stable Anesthetic complications: no   No notable events documented.   Last Vitals:  Vitals:   12/07/23 1330 12/07/23 1345  BP: 97/70 126/87  Pulse:  60  Resp:  15  Temp:  (!) 35.9 C  SpO2:  100%    Last Pain:  Vitals:   12/07/23 1315  TempSrc:   PainSc: 5                  VAN STAVEREN,Jony Ladnier

## 2023-12-07 NOTE — Op Note (Signed)
 Preoperative diagnosis: Left breast DCIS with close margins on initial excision.  Postoperative diagnosis: Same.   Procedure: Reexcision of posterior margin, left breast Anesthesia: GETA  Surgeon: Dr. Conrado Delay  Wound Classification: Clean  Indications: Patient is a 43 y.o. female with DCIS.  Lumpectomy completed along with reconstruction.  Final path report noted the posterior margin to be extremely close, so recommendation was to proceed with reexcision to minimize chance of recurrence after discussion at tumor board  Specimen: Left breast posterior margin  Complications: None  Estimated Blood Loss: 10 mL  Findings: 1.Pathology call refers gross examination of margins was negative  Description of procedure: The patient was taken to the operating room and placed supine on the operating table, and after general anesthesia the left breast and axilla were prepped and draped in the usual sterile fashion. A time-out was completed verifying correct patient, procedure, site, positioning, and implant(s) and/or special equipment prior to beginning this procedure.  By identifying the DCIS on initial mammography, the probable trajectory and location of the mass accommodating for the subsequent reconstruction was visualized.  The skin incision was made through the already present areolar incision after infusion of local. Flaps were raised and  Sharp and blunt dissection was then taken down to the likely area of previous lumpectomy, where a pocket of old hematoma like fluid was noted and typical scar tissue within a former biopsy cavity was identified. Posterior aspect of this cavity was removed, confirming size of the margin similar to the original lumpectomy specimen size.  The specimen was oriented with paint. Gross margin analysis by pathology confirmed all margins cleared on initial inspection.  Hemostasis achieved and the wound closed in layers with  interrupted sutures of 3-0 Vicryl in deep  dermal layer and a running subcuticular suture of Monocryl 4-0, then dressed with dermabond. The patient tolerated the procedure well and was taken to the postanesthesia care unit in stable condition. Sponge and instrument count correct at end of procedure.

## 2023-12-07 NOTE — Discharge Instructions (Signed)
 Removal, Care After This sheet gives you information about how to care for yourself after your procedure. Your health care provider may also give you more specific instructions. If you have problems or questions, contact your health care provider. What can I expect after the procedure? After the procedure, it is common to have: Soreness. Bruising. Itching. Follow these instructions at home: site care Follow instructions from your health care provider about how to take care of your site. Make sure you: Wash your hands with soap and water before and after you change your bandage (dressing). If soap and water are not available, use hand sanitizer. Leave stitches (sutures), skin glue, or adhesive strips in place. These skin closures may need to stay in place for 2 weeks or longer. If adhesive strip edges start to loosen and curl up, you may trim the loose edges. Do not remove adhesive strips completely unless your health care provider tells you to do that. If the area bleeds or bruises, apply gentle pressure for 10 minutes. OK TO SHOWER IN 24HRS  Check your site every day for signs of infection. Check for: Redness, swelling, or pain. Fluid or blood. Warmth. Pus or a bad smell.  General instructions Rest and then return to your normal activities as told by your health care provider.  tylenol and advil as needed for discomfort.  Please alternate between the two every four hours as needed for pain.    Use narcotics, if prescribed, only when tylenol and motrin is not enough to control pain.  325-650mg  every 8hrs to max of 3000mg /24hrs (including the 325mg  in every norco dose) for the tylenol.    Advil up to 800mg  per dose every 8hrs as needed for pain.   Keep all follow-up visits as told by your health care provider. This is important. Contact a health care provider if: You have redness, swelling, or pain around your site. You have fluid or blood coming from your site. Your site feels warm to  the touch. You have pus or a bad smell coming from your site. You have a fever. Your sutures, skin glue, or adhesive strips loosen or come off sooner than expected. Get help right away if: You have bleeding that does not stop with pressure or a dressing. Summary After the procedure, it is common to have some soreness, bruising, and itching at the site. Follow instructions from your health care provider about how to take care of your site. Check your site every day for signs of infection. Contact a health care provider if you have redness, swelling, or pain around your site, or your site feels warm to the touch. Keep all follow-up visits as told by your health care provider. This is important. This information is not intended to replace advice given to you by your health care provider. Make sure you discuss any questions you have with your health care provider. Document Released: 09/04/2015 Document Revised: 02/05/2018 Document Reviewed: 02/05/2018 Elsevier Interactive Patient Education  [employer].

## 2023-12-07 NOTE — Interval H&P Note (Signed)
 No change. OK to proceed.

## 2023-12-07 NOTE — Progress Notes (Signed)
 Patient into recliner chair, with assist of one. Very sleepy, states pain level 5/10 left breast area. Difficult to assess due to lethargy, anesthesia called to bedside to assist:  Dr. Van Staveren, per his assessment, do not give pain medications unless patient more awake. Patient awake/alert but after answering questions will go back to sleep, will not open her eyes when she does answer a question. Did tolerate gingerale while in pacu.

## 2023-12-08 ENCOUNTER — Encounter: Payer: Self-pay | Admitting: Surgery

## 2023-12-08 LAB — SURGICAL PATHOLOGY

## 2023-12-21 ENCOUNTER — Ambulatory Visit: Admitting: Student

## 2023-12-21 VITALS — BP 120/73 | HR 81

## 2023-12-21 DIAGNOSIS — D0512 Intraductal carcinoma in situ of left breast: Secondary | ICD-10-CM

## 2023-12-21 NOTE — Progress Notes (Addendum)
 Patient is a 43 year old female who underwent Scout tag localized left breast partial mastectomy and left axillary sentinel lymph node biopsy with Dr. Rosea Conch followed by immediate bilateral oncoplastic mastopexy/breast reduction with Dr. Orin Birk on 10/19/2023.  She is approximately 2 months postop.  She presents to the clinic today for postoperative follow-up.  Patient was last seen in the clinic on 11/20/2023.  At this visit, patient was doing well.  On exam, breasts were soft and fairly symmetric.  There is no overlying erythema.  There was a little bit of firmness noted to the inferior aspects of the vertical incisions consistent with scar tissue.  There was some firmness noted near the left NAC that was consistent with fat necrosis versus scar tissue.  Incisions were well-healed.  Since last appointment, per chart review patient underwent left breast lumpectomy with Dr. Rosea Conch on 12/07/2023.  Today, patient reports she is doing well.  She states that she had surgery with Dr. Rosea Conch and they went through the vertical incision on the left breast.  She states that surgery went well and that the margins are now free.  Patient reports that what she thought may have been cording a few weeks ago, has now completely resolved since her surgery with Dr. Rosea Conch.  She states that she still has the little area of firmness near her left nipple.  She denies the area of firmness growing in size.  She denies any overlying skin changes.  She denies any other problems or concerns.  Present on exam.  On exam, patient is sitting upright in no acute distress.  Breasts are overall fairly soft and symmetric.  There is a little bit of firmness noted near the left NAC, similar to previous exam.  This appears to be consistent with fat necrosis or scar tissue.  There are no overlying skin changes.  There is no overlying erythema to either breast.  No fluid collections palpated on exam.  NAC's appear to be healthy bilaterally.   Incisions are intact and healed.  There does appear to be a little bit of hypertrophic scarring bilaterally, no keloiding though.  There are no signs of infection on exam.  Recommended that patient continue to massage the area of firmness and closely monitor it.  Discussed with her that if it were to grow in size or the skin with a change over it, she should let us  know immediately.  Patient expressed understanding.  Recommended that patient start silicone based products over her incisions and gently massage the incisions to help with the scarring.  We did discuss that if they bother her, we can consider steroid injections at a later time.  Patient to follow back up in 2 to 3 months.  I instructed her to call in the meantime she has any questions or concerns about anything.

## 2023-12-25 ENCOUNTER — Encounter: Payer: Self-pay | Admitting: *Deleted

## 2023-12-25 NOTE — Progress Notes (Signed)
 Sheena [name] was asking about follow up with Dr. Wilhelmenia Harada.  She will see Dr. Wilhelmenia Harada about 2 weeks after radiation is completed.   We will get that scheduled once we know her radiation dates.

## 2023-12-27 ENCOUNTER — Ambulatory Visit: Attending: Oncology | Admitting: Occupational Therapy

## 2023-12-27 ENCOUNTER — Ambulatory Visit: Admitting: Occupational Therapy

## 2023-12-27 ENCOUNTER — Ambulatory Visit
Admission: RE | Admit: 2023-12-27 | Discharge: 2023-12-27 | Disposition: A | Discharge status: Home / Self Care | Source: Ambulatory Visit | Attending: Radiation Oncology | Admitting: Radiation Oncology

## 2023-12-27 ENCOUNTER — Encounter: Payer: Self-pay | Admitting: Radiation Oncology

## 2023-12-27 VITALS — BP 101/70 | HR 74 | Temp 97.7°F | Wt 137.0 lb

## 2023-12-27 DIAGNOSIS — M25612 Stiffness of left shoulder, not elsewhere classified: Secondary | ICD-10-CM | POA: Insufficient documentation

## 2023-12-27 DIAGNOSIS — D0512 Intraductal carcinoma in situ of left breast: Secondary | ICD-10-CM | POA: Insufficient documentation

## 2023-12-27 DIAGNOSIS — L905 Scar conditions and fibrosis of skin: Secondary | ICD-10-CM | POA: Insufficient documentation

## 2023-12-27 DIAGNOSIS — Z510 Encounter for antineoplastic radiation therapy: Secondary | ICD-10-CM | POA: Diagnosis present

## 2023-12-27 NOTE — Therapy (Signed)
 OUTPATIENT OCCUPATIONAL THERAPY BREAST CANCER POSTOP TREATMENT   Patient Name: Sheena [name] MRN: [id number] DOB:07/24/1981, 43 y.o., female Today's Date: 12/27/2023  END OF SESSION:  OT End of Session - 12/27/23 1410     Visit Number 2    Number of Visits 4    Date for OT Re-Evaluation 02/28/24    OT Start Time 1030    OT Stop Time 1054    OT Time Calculation (min) 24 min    Activity Tolerance Patient tolerated treatment well    Behavior During Therapy [facility] for tasks assessed/performed             Past Medical History:  Diagnosis Date   Anemia    Ductal carcinoma in situ (DCIS) of left breast 2025   Herpes, genital    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, hpv+   Past Surgical History:  Procedure Laterality Date   BREAST BIOPSY Left 08/31/2023   left breast stereo calcs, coil clip, path pending   BREAST BIOPSY Left 08/31/2023   MM LT BREAST BX W LOC DEV 1ST LESION IMAGE BX SPEC STEREO GUIDE 08/31/2023 [facility]   BREAST LUMPECTOMY Left 12/07/2023   Procedure: BREAST LUMPECTOMY;  Surgeon: Conrado Delay, DO;  Location: [facility];  Service: General;  Laterality: Left;   BREAST REDUCTION SURGERY Bilateral 10/19/2023   Procedure: BILATERAL ONCOPLASTIC BREAST REDUCTION;  Surgeon: Thornell Flirt, DO;  Location: [facility];  Service: Plastics;  Laterality: Bilateral;   CESAREAN SECTION  2005; 2015   [facility] x2   CESAREAN SECTION     PART MASTECTOMY,RADIO FREQUENCY LOCALIZER,AXILLARY SENTINEL NODE BIOPSY Left 10/19/2023   Procedure: PART MASTECTOMY,RADIO FREQUENCY LOCALIZER,AXILLARY SENTINEL NODE BIOPSY;  Surgeon: Conrado Delay, DO;  Location: [facility];  Service: General;  Laterality: Left;   Patient Active Problem List   Diagnosis Date Noted   Genetic testing 09/25/2023   Family history of malignant neoplasm of prostate 09/11/2023   Ductal carcinoma in situ (DCIS) of left breast 09/06/2023   Family history of breast cancer 09/06/2023    PCP: Thalia Filler NP  REFERRING  PROVIDER: Dr Arden Kotyk DIAG: Scar tissue with lymphatic cording left and shoulder stiffness  THERAPY DIAG:  Stiffness of left shoulder, not elsewhere classified  Scar condition and fibrosis of skin  Rationale for Evaluation and Treatment: Rehabilitation  ONSET DATE: 10/19/23  SUBJECTIVE:                                                                                                                                                                                           SUBJECTIVE STATEMENT: Patient reports she had 10/19/2023 left  lumpectomy by Dr. Rosea Conch.  With a partial breast reduction on bilateral breast by Dr. Orin Birk.  Patient reports last time she had some lymphatic cording in her L  arm and breast.  Returning now after having  reexcision by Dr. Rosea Conch - my cording is better now after the surgery- doing okay  PERTINENT HISTORY:  Patient had 10/19/2023 left lumpectomy by Dr. Rosea Conch.  Was a breast reduction by Dr. Orin Birk on bilateral breasts.  Patient had reexcision of the left breast by Dr. Rosea Conch. 12/07/23  Patient present at OT evaluation  with reports of lymphatic cording in left upper extremity with overhead range of motion as well as into the breast. But today better after reexcision  PATIENT GOALS:   reduce lymphedema risk and learn post op HEP.   PAIN:  Are you having pain?  no  PRECAUTIONS: Active CA     HAND DOMINANCE: right  WEIGHT BEARING RESTRICTIONS: No  FALLS:  Has patient fallen in last 6 months? No  LIVING ENVIRONMENT: Patient lives with: With family.  OCCUPATION: She works in Scientific laboratory technician: Patient reports she likes to workout but at home with her weights in on mat.  Spent time with 59 year old son   OBJECTIVE:  COGNITION: Overall cognitive status: Within functional limits for tasks assessed    POSTURE:  Forward head and rounded shoulders posture  UPPER EXTREMITY AROM/PROM: Bilateral shoulder active range of motion within normal  limits overhead.  No discomfort or pain over head after surgery Starting radiation and able to get in position  CERVICAL AROM: All within normal limits:    UPPER EXTREMITY STRENGTH: WFL  LYMPHEDEMA ASSESSMENTS:   LANDMARK RIGHT   eval  10 cm proximal to olecranon process  15 cm 28.5  Olecranon process 22.8  10 cm proximal to ulnar styloid process   Just proximal to ulnar styloid process   Across hand at thumb web space   At base of 2nd digit   (Blank rows = not tested)  LANDMARK LEFT   eval  10 cm proximal to olecranon process 15 cm 28 cm   Olecranon process 23  10 cm proximal to ulnar styloid process   Just proximal to ulnar styloid process   Across hand at thumb web space   At base of 2nd digit   (Blank rows = not tested)  L-DEX LYMPHEDEMA SCREENING:  The patient was assessed using the L-Dex machine today to produce a lymphedema index baseline score. The patient will be reassessed on a regular basis (typically every 3 months) to obtain new L-Dex scores. If the score is > 6.5 points away from his/her baseline score indicating onset of subclinical lymphedema, it will be recommended to wear a compression garment for 4 weeks, 12 hours per day and then be reassessed. If the score continues to be > 6.5 points from baseline at reassessment, we will initiate lymphedema treatment. Assessing in this manner has a 95% rate of preventing clinically significant lymphedema.    L-DEX FLOWSHEETS - 12/27/23 1500       L-DEX LYMPHEDEMA SCREENING   Measurement Type Unilateral    L-DEX MEASUREMENT EXTREMITY Upper Extremity    POSITION  Standing    DOMINANT SIDE Right    At Risk Side Left    L-DEX SCORE (UNILATERAL) 12.5              Patient with abnormal score in pink.  But could be because of reports of lymphatic cording and irritation in the left upper  arm last visit and postop from reexcition     PATIENT EDUCATION:  Education details: Lymphedema risk reduction and post op  shoulder/posture HEP Person educated: Patient Education method: Explanation, Demonstration, Handout Education comprehension: Patient verbalized understanding and returned demonstration   ASSESSMENT:  CLINICAL IMPRESSION: Patient  is postop  left lumpectomy with bilateral breast reduction.  And now had reexcision by Dr. Rosea Conch 12/07/23 - left breast.  Pt lymphatic cording improved since last seen and after last surgery- postop swelling still present -starting radiation next week - SOZO L-dex still increased last 2 visit because of cording and no postop swelling still  - She will benefit from from a  L-Dex screening in 4 wks by Breast Navigator and after every 3 months for 2 years to detect subclinical lymphedema.   Pt can be refer back to me for follow up as needed - if will benefit from skilled therapeutic intervention to improve on the following deficits: Decreased knowledge of precautions and lymphedema education, impaired UE functional use, pain, decreased ROM, postural dysfunction.   OT treatment/interventions: ADL/self-care home management, pt/family education, therapeutic exercise,manual therapy  REHAB POTENTIAL: Good  CLINICAL DECISION MAKING: Stable/uncomplicated  EVALUATION COMPLEXITY: Low   GOALS: Goals reviewed with patient? YES  LONG TERM GOALS: (STG=LTG)    Name Target Date Goal status  1 Pt will be able to verbalize understanding of pertinent lymphedema risk reduction practices relevant to her dx specifically related to skin care.  Baseline:  No knowledge 12 wks met  2 Pt will be able to return demo and/or verbalize understanding of the post op HEP related to regaining shoulder ROM. Baseline:  No knowledge Met today Achieved at eval       4 Pt will demo she has regained full shoulder ROM and function post operatively compared to baselines.  Baseline: See objective measurements taken today. 12 wks met    PLAN:  OT FREQUENCY/DURATION: EVAL and 4  follow up appointments  for 12 wks   PLAN FOR NEXT SESSION: will reassess  2-3  weeks post op to determine needs.   Patient will follow up at outpatient cancer rehab 2-3 weeks following surgery.  If the patient requires occupational therapy at that time, a specific plan will be dictated and sent to the referring physician for approval. T Occupational Therapy Information for After Breast Cancer Surgery/Treatment:  Lymphedema is a swelling condition that you may be at risk for in your arm if you have lymph nodes removed from the armpit area.  After a sentinel node biopsy, the risk is approximately 5-9% and is higher after an axillary node dissection.  There is treatment available for this condition and it is not life-threatening.  Contact your physician or occupational therapist with concerns. You may begin the 4 shoulder/posture exercises (see additional sheet) when permitted by your physician (typically a week after surgery).  If you have drains, you may need to wait until those are removed before beginning range of motion exercises.  A general recommendation is to not lift your arms above shoulder height until drains are removed.  These exercises should be done to your tolerance and gently.  This is not a "no pain/no gain" type of recovery so listen to your body and stretch into the range of motion that you can tolerate, stopping if you have pain.  If you are having immediate reconstruction, ask your plastic surgeon about doing exercises as he or she may want you to wait. .  While undergoing any medical procedure  or treatment, try to avoid blood pressure being taken or needle sticks from occurring on the arm on the side of cancer.   This recommendation begins after surgery and continues for the rest of your life.  This may help reduce your risk of getting lymphedema (swelling in your arm). An excellent resource for those seeking information on lymphedema is the National Lymphedema Network's web site. It can be accessed at  www.lymphnet.org If you notice swelling in your hand, arm or breast at any time following surgery (even if it is many years from now), please contact your doctor or occupational therapist to discuss this.  Lymphedema can be treated at any time but it is easier for you if it is treated early on.  If you feel like your shoulder motion is not returning to normal in a reasonable amount of time, please contact your surgeon or occupational therapist.  [facility] [phone]. [address], [place], [place] [address]  Arlester Ladd  Heloise Lobo, OTR/L,CLT 12/27/2023, 2:12 PM

## 2023-12-27 NOTE — Consult Note (Signed)
 NEW PATIENT EVALUATION  Name: Sheena [name]  MRN: [id number]  Date:   12/27/2023     DOB: [birth year]   This 43 y.o. female patient presents to the clinic for initial evaluation of ER positive ductal carcinoma site to the left breast status post excision and reexcision.  REFERRING PHYSICIAN: Carollynn Cirri, NP  CHIEF COMPLAINT:  Chief Complaint  Patient presents with   Breast Cancer    DIAGNOSIS: The encounter diagnosis was Intraductal carcinoma in situ of left breast.   PREVIOUS INVESTIGATIONS:  Mammograms ultrasound reviewed Clinical notes reviewed Pathology reports reviewed  HPI: Patient is a 43 year old female who presented with an abnormal mammogram showing a 12 mm group of amorphous calcifications in the outer central portion of the left breast with a low suspicious for malignancy.  She underwent stereotactic biopsy which was positive for ductal carcinoma in situ grade 3 with comedonecrosis.  MRI of her breast showed biopsy-proven left breast DCIS measuring 1.8 cm in greatest dimension there was a second area of similar kinetics 1.2 cm non-mass enhancement in the lower outer quadrant of the left breast.  Biopsy of that area was benign.  Showing stromal fibrosis and fibroma toyed changes.  She underwent underwent a wide local excision of the left breast plus bilateral oncoplastic mastopexy and breast reduction surgery by Dr. Pam Bode.  She underwent also wide local excision showing a 13 mm area of again grade 3 ductal carcinoma in situ with expansive median necrosis.  Posterior margin was close at 0.17 mm she did undergo reexcision of that margin which was negative.  There were 6 sentinel lymph nodes examined all negative for metastatic disease.  Tumor was ER positive strongly.  She has done well postoperatively.  She is seen today for radiation oncology opinion she specifically denies breast tenderness cough or bone pain.  PLANNED TREATMENT REGIMEN: Hypofractionated left whole  breast radiation with DIBH  PAST MEDICAL HISTORY:  has a past medical history of Anemia, Ductal carcinoma in situ (DCIS) of left breast (2025), Herpes, genital, and High risk HPV infection (11/2013).    PAST SURGICAL HISTORY:  Past Surgical History:  Procedure Laterality Date   BREAST BIOPSY Left 08/31/2023   left breast stereo calcs, coil clip, path pending   BREAST BIOPSY Left 08/31/2023   MM LT BREAST BX W LOC DEV 1ST LESION IMAGE BX SPEC STEREO GUIDE 08/31/2023 [facility]   BREAST LUMPECTOMY Left 12/07/2023   Procedure: BREAST LUMPECTOMY;  Surgeon: Conrado Delay, DO;  Location: [facility];  Service: General;  Laterality: Left;   BREAST REDUCTION SURGERY Bilateral 10/19/2023   Procedure: BILATERAL ONCOPLASTIC BREAST REDUCTION;  Surgeon: Thornell Flirt, DO;  Location: [facility];  Service: Plastics;  Laterality: Bilateral;   CESAREAN SECTION  2005; 2015   [facility] x2   CESAREAN SECTION     PART MASTECTOMY,RADIO FREQUENCY LOCALIZER,AXILLARY SENTINEL NODE BIOPSY Left 10/19/2023   Procedure: PART MASTECTOMY,RADIO FREQUENCY LOCALIZER,AXILLARY SENTINEL NODE BIOPSY;  Surgeon: Conrado Delay, DO;  Location: [facility];  Service: General;  Laterality: Left;    FAMILY HISTORY: family history includes Breast cancer (age of onset: 33) in her mother; Cancer in her mother; Cancer (age of onset: 99 - 37) in her father; Cancer (age of onset: 5) in her maternal aunt; Cancer (age of onset: 32) in her maternal aunt; Prostate cancer in her father.  SOCIAL HISTORY:  reports that she has never smoked. She has never used smokeless tobacco. She reports that she does not drink alcohol and does not  use drugs.  ALLERGIES: Patient has no known allergies.  MEDICATIONS:  Current Outpatient Medications  Medication Sig Dispense Refill   acetaminophen  (TYLENOL ) 650 MG CR tablet Take 1 tablet (650 mg total) by mouth every 8 (eight) hours as needed for pain or fever. 30 tablet 0   ferrous sulfate 325 (65 FE) MG tablet Take  325 mg by mouth daily.     ibuprofen  (ADVIL ) 800 MG tablet Take 1 tablet (800 mg total) by mouth every 8 (eight) hours as needed for mild pain (pain score 1-3) or moderate pain (pain score 4-6). 30 tablet 0   VITAMIN D PO Take 1 capsule by mouth daily.     No current facility-administered medications for this encounter.    ECOG PERFORMANCE STATUS:  0 - Asymptomatic  REVIEW OF SYSTEMS: Patient denies any weight loss, fatigue, weakness, fever, chills or night sweats. Patient denies any loss of vision, blurred vision. Patient denies any ringing  of the ears or hearing loss. No irregular heartbeat. Patient denies heart murmur or history of fainting. Patient denies any chest pain or pain radiating to her upper extremities. Patient denies any shortness of breath, difficulty breathing at night, cough or hemoptysis. Patient denies any swelling in the lower legs. Patient denies any nausea vomiting, vomiting of blood, or coffee ground material in the vomitus. Patient denies any stomach pain. Patient states has had normal bowel movements no significant constipation or diarrhea. Patient denies any dysuria, hematuria or significant nocturia. Patient denies any problems walking, swelling in the joints or loss of balance. Patient denies any skin changes, loss of hair or loss of weight. Patient denies any excessive worrying or anxiety or significant depression. Patient denies any problems with insomnia. Patient denies excessive thirst, polyuria, polydipsia. Patient denies any swollen glands, patient denies easy bruising or easy bleeding. Patient denies any recent infections, allergies or URI. Patient "s visual fields have not changed significantly in recent time.   PHYSICAL EXAM: BP 101/70   Pulse 74   Temp 97.7 F (36.5 C) (Tympanic)   Wt 137 lb (62.1 kg)   LMP 11/08/2023 (Approximate)   BMI 22.80 kg/m  Patient has bilateral incisions around the nipple areolar complex consistent with mastopexy as well as wide  local excision of her left sided DCIS.  No dominant masses noted in either breast no axillary or supraclavicular adenopathy is identified.  Well-developed well-nourished patient in NAD. HEENT reveals PERLA, EOMI, discs not visualized.  Oral cavity is clear. No oral mucosal lesions are identified. Neck is clear without evidence of cervical or supraclavicular adenopathy. Lungs are clear to A&P. Cardiac examination is essentially unremarkable with regular rate and rhythm without murmur rub or thrill. Abdomen is benign with no organomegaly or masses noted. Motor sensory and DTR levels are equal and symmetric in the upper and lower extremities. Cranial nerves II through XII are grossly intact. Proprioception is intact. No peripheral adenopathy or edema is identified. No motor or sensory levels are noted. Crude visual fields are within normal range.  LABORATORY DATA: Pathology reports reviewed    RADIOLOGY RESULTS: Mammograms MRI scans all reviewed compatible with above-stated findings   IMPRESSION: High-grade DCIS of the left breast status post wide local excision and reexcision ER positive and 43 year old female  PLAN: At this time I have recommended whole breast radiation.  I would plan on treating her whole breast over 3 weeks boosting her scar another 1000 cGy using photon beam therapy.  Risks and benefits of treatment including skin reaction  fatigue alteration of blood counts possible inclusion of superficial lung all were reviewed with the patient in detail.  We will try to ask her to do deep inspiration breath-hold to remove her heart from our treatment field.  Patient comprehends her recommendations well I personally set up and ordered CT simulation for early next week.  Patient also benefit from endocrine therapy after completion of radiation.  I would like to take this opportunity to thank you for allowing me to participate in the care of your patient.Glenis Langdon, MD

## 2024-01-03 ENCOUNTER — Ambulatory Visit
Admission: RE | Admit: 2024-01-03 | Discharge: 2024-01-03 | Disposition: A | Discharge status: Home / Self Care | Source: Ambulatory Visit | Attending: Radiation Oncology | Admitting: Radiation Oncology

## 2024-01-03 DIAGNOSIS — Z510 Encounter for antineoplastic radiation therapy: Secondary | ICD-10-CM | POA: Diagnosis not present

## 2024-01-04 ENCOUNTER — Other Ambulatory Visit: Payer: Self-pay | Admitting: *Deleted

## 2024-01-04 DIAGNOSIS — D0512 Intraductal carcinoma in situ of left breast: Secondary | ICD-10-CM

## 2024-01-06 DIAGNOSIS — Z510 Encounter for antineoplastic radiation therapy: Secondary | ICD-10-CM | POA: Diagnosis not present

## 2024-01-10 ENCOUNTER — Ambulatory Visit
Admission: RE | Admit: 2024-01-10 | Discharge: 2024-01-10 | Disposition: A | Discharge status: Home / Self Care | Source: Ambulatory Visit | Attending: Radiation Oncology | Admitting: Radiation Oncology

## 2024-01-10 DIAGNOSIS — Z510 Encounter for antineoplastic radiation therapy: Secondary | ICD-10-CM | POA: Diagnosis not present

## 2024-01-11 ENCOUNTER — Ambulatory Visit
Admission: RE | Admit: 2024-01-11 | Discharge: 2024-01-11 | Disposition: A | Discharge status: Home / Self Care | Source: Ambulatory Visit | Attending: Radiation Oncology | Admitting: Radiation Oncology

## 2024-01-11 ENCOUNTER — Other Ambulatory Visit: Payer: Self-pay

## 2024-01-11 DIAGNOSIS — Z510 Encounter for antineoplastic radiation therapy: Secondary | ICD-10-CM | POA: Diagnosis not present

## 2024-01-11 LAB — RAD ONC ARIA SESSION SUMMARY
Course Elapsed Days: 0
Plan Fractions Treated to Date: 1
Plan Prescribed Dose Per Fraction: 2.66 Gy
Plan Total Fractions Prescribed: 16
Plan Total Prescribed Dose: 42.56 Gy
Reference Point Dosage Given to Date: 2.66 Gy
Reference Point Session Dosage Given: 2.66 Gy
Session Number: 1

## 2024-01-12 ENCOUNTER — Other Ambulatory Visit: Payer: Self-pay

## 2024-01-12 ENCOUNTER — Ambulatory Visit
Admission: RE | Admit: 2024-01-12 | Discharge: 2024-01-12 | Disposition: A | Discharge status: Home / Self Care | Source: Ambulatory Visit | Attending: Radiation Oncology | Admitting: Radiation Oncology

## 2024-01-12 DIAGNOSIS — Z510 Encounter for antineoplastic radiation therapy: Secondary | ICD-10-CM | POA: Diagnosis not present

## 2024-01-12 LAB — RAD ONC ARIA SESSION SUMMARY
Course Elapsed Days: 1
Plan Fractions Treated to Date: 2
Plan Prescribed Dose Per Fraction: 2.66 Gy
Plan Total Fractions Prescribed: 16
Plan Total Prescribed Dose: 42.56 Gy
Reference Point Dosage Given to Date: 5.32 Gy
Reference Point Session Dosage Given: 2.66 Gy
Session Number: 2

## 2024-01-16 ENCOUNTER — Inpatient Hospital Stay

## 2024-01-16 ENCOUNTER — Ambulatory Visit
Admission: RE | Admit: 2024-01-16 | Discharge: 2024-01-16 | Disposition: A | Discharge status: Home / Self Care | Source: Ambulatory Visit | Attending: Radiation Oncology | Admitting: Radiation Oncology

## 2024-01-16 ENCOUNTER — Other Ambulatory Visit: Payer: Self-pay

## 2024-01-16 DIAGNOSIS — D0512 Intraductal carcinoma in situ of left breast: Secondary | ICD-10-CM | POA: Insufficient documentation

## 2024-01-16 DIAGNOSIS — Z510 Encounter for antineoplastic radiation therapy: Secondary | ICD-10-CM | POA: Diagnosis not present

## 2024-01-16 LAB — CBC (CANCER CENTER ONLY)
HCT: 36.5 % (ref 36.0–46.0)
Hemoglobin: 12.6 g/dL (ref 12.0–15.0)
MCH: 32.6 pg (ref 26.0–34.0)
MCHC: 34.5 g/dL (ref 30.0–36.0)
MCV: 94.3 fL (ref 80.0–100.0)
Platelet Count: 337 10*3/uL (ref 150–400)
RBC: 3.87 MIL/uL (ref 3.87–5.11)
RDW: 12 % (ref 11.5–15.5)
WBC Count: 5.3 10*3/uL (ref 4.0–10.5)
nRBC: 0 % (ref 0.0–0.2)

## 2024-01-16 LAB — RAD ONC ARIA SESSION SUMMARY
Course Elapsed Days: 5
Plan Fractions Treated to Date: 3
Plan Prescribed Dose Per Fraction: 2.66 Gy
Plan Total Fractions Prescribed: 16
Plan Total Prescribed Dose: 42.56 Gy
Reference Point Dosage Given to Date: 7.98 Gy
Reference Point Session Dosage Given: 2.66 Gy
Session Number: 3

## 2024-01-17 ENCOUNTER — Other Ambulatory Visit: Payer: Self-pay

## 2024-01-17 ENCOUNTER — Ambulatory Visit
Admission: RE | Admit: 2024-01-17 | Discharge: 2024-01-17 | Disposition: A | Discharge status: Home / Self Care | Source: Ambulatory Visit | Attending: Radiation Oncology | Admitting: Radiation Oncology

## 2024-01-17 DIAGNOSIS — Z510 Encounter for antineoplastic radiation therapy: Secondary | ICD-10-CM | POA: Diagnosis not present

## 2024-01-17 LAB — RAD ONC ARIA SESSION SUMMARY
Course Elapsed Days: 6
Plan Fractions Treated to Date: 4
Plan Prescribed Dose Per Fraction: 2.66 Gy
Plan Total Fractions Prescribed: 16
Plan Total Prescribed Dose: 42.56 Gy
Reference Point Dosage Given to Date: 10.64 Gy
Reference Point Session Dosage Given: 2.66 Gy
Session Number: 4

## 2024-01-18 ENCOUNTER — Ambulatory Visit
Admission: RE | Admit: 2024-01-18 | Discharge: 2024-01-18 | Disposition: A | Discharge status: Home / Self Care | Source: Ambulatory Visit | Attending: Radiation Oncology | Admitting: Radiation Oncology

## 2024-01-18 ENCOUNTER — Other Ambulatory Visit: Payer: Self-pay

## 2024-01-18 DIAGNOSIS — Z510 Encounter for antineoplastic radiation therapy: Secondary | ICD-10-CM | POA: Diagnosis not present

## 2024-01-18 LAB — RAD ONC ARIA SESSION SUMMARY
Course Elapsed Days: 7
Plan Fractions Treated to Date: 5
Plan Prescribed Dose Per Fraction: 2.66 Gy
Plan Total Fractions Prescribed: 16
Plan Total Prescribed Dose: 42.56 Gy
Reference Point Dosage Given to Date: 13.3 Gy
Reference Point Session Dosage Given: 2.66 Gy
Session Number: 5

## 2024-01-19 ENCOUNTER — Other Ambulatory Visit: Payer: Self-pay

## 2024-01-19 ENCOUNTER — Ambulatory Visit
Admission: RE | Admit: 2024-01-19 | Discharge: 2024-01-19 | Disposition: A | Discharge status: Home / Self Care | Source: Ambulatory Visit | Attending: Radiation Oncology | Admitting: Radiation Oncology

## 2024-01-19 DIAGNOSIS — Z510 Encounter for antineoplastic radiation therapy: Secondary | ICD-10-CM | POA: Diagnosis not present

## 2024-01-19 LAB — RAD ONC ARIA SESSION SUMMARY
Course Elapsed Days: 8
Plan Fractions Treated to Date: 6
Plan Prescribed Dose Per Fraction: 2.66 Gy
Plan Total Fractions Prescribed: 16
Plan Total Prescribed Dose: 42.56 Gy
Reference Point Dosage Given to Date: 15.96 Gy
Reference Point Session Dosage Given: 2.66 Gy
Session Number: 6

## 2024-01-22 ENCOUNTER — Other Ambulatory Visit: Payer: Self-pay

## 2024-01-22 ENCOUNTER — Ambulatory Visit
Admission: RE | Admit: 2024-01-22 | Discharge: 2024-01-22 | Disposition: A | Discharge status: Home / Self Care | Source: Ambulatory Visit | Attending: Radiation Oncology | Admitting: Radiation Oncology

## 2024-01-22 ENCOUNTER — Inpatient Hospital Stay: Admitting: *Deleted

## 2024-01-22 DIAGNOSIS — D0512 Intraductal carcinoma in situ of left breast: Secondary | ICD-10-CM | POA: Diagnosis present

## 2024-01-22 DIAGNOSIS — Z510 Encounter for antineoplastic radiation therapy: Secondary | ICD-10-CM | POA: Diagnosis not present

## 2024-01-22 LAB — RAD ONC ARIA SESSION SUMMARY
Course Elapsed Days: 11
Plan Fractions Treated to Date: 7
Plan Prescribed Dose Per Fraction: 2.66 Gy
Plan Total Fractions Prescribed: 16
Plan Total Prescribed Dose: 42.56 Gy
Reference Point Dosage Given to Date: 18.62 Gy
Reference Point Session Dosage Given: 2.66 Gy
Session Number: 7

## 2024-01-23 ENCOUNTER — Ambulatory Visit
Admission: RE | Admit: 2024-01-23 | Discharge: 2024-01-23 | Disposition: A | Discharge status: Home / Self Care | Source: Ambulatory Visit | Attending: Radiation Oncology | Admitting: Radiation Oncology

## 2024-01-23 ENCOUNTER — Other Ambulatory Visit: Payer: Self-pay

## 2024-01-23 DIAGNOSIS — Z510 Encounter for antineoplastic radiation therapy: Secondary | ICD-10-CM | POA: Diagnosis not present

## 2024-01-23 LAB — RAD ONC ARIA SESSION SUMMARY
Course Elapsed Days: 12
Plan Fractions Treated to Date: 8
Plan Prescribed Dose Per Fraction: 2.66 Gy
Plan Total Fractions Prescribed: 16
Plan Total Prescribed Dose: 42.56 Gy
Reference Point Dosage Given to Date: 21.28 Gy
Reference Point Session Dosage Given: 2.66 Gy
Session Number: 8

## 2024-01-24 ENCOUNTER — Ambulatory Visit
Admission: RE | Admit: 2024-01-24 | Discharge: 2024-01-24 | Disposition: A | Discharge status: Home / Self Care | Source: Ambulatory Visit | Attending: Radiation Oncology | Admitting: Radiation Oncology

## 2024-01-24 ENCOUNTER — Other Ambulatory Visit: Payer: Self-pay

## 2024-01-24 ENCOUNTER — Inpatient Hospital Stay: Admitting: *Deleted

## 2024-01-24 DIAGNOSIS — D0512 Intraductal carcinoma in situ of left breast: Secondary | ICD-10-CM | POA: Insufficient documentation

## 2024-01-24 DIAGNOSIS — Z9189 Other specified personal risk factors, not elsewhere classified: Secondary | ICD-10-CM

## 2024-01-24 DIAGNOSIS — Z510 Encounter for antineoplastic radiation therapy: Secondary | ICD-10-CM | POA: Diagnosis not present

## 2024-01-24 LAB — RAD ONC ARIA SESSION SUMMARY
Course Elapsed Days: 13
Plan Fractions Treated to Date: 9
Plan Prescribed Dose Per Fraction: 2.66 Gy
Plan Total Fractions Prescribed: 16
Plan Total Prescribed Dose: 42.56 Gy
Reference Point Dosage Given to Date: 23.94 Gy
Reference Point Session Dosage Given: 2.66 Gy
Session Number: 9

## 2024-01-24 NOTE — Progress Notes (Signed)
 SUBJECTIVE: Pt returns for her  L-Dex screen. She has noticed some breast swelling during radiation.   Her overall L-Dex number has improved, she will be checked again post radiation.        SOZO SCREENING: Patient was assessed today using the SOZO machine to determine the lymphedema index score. This was compared to her baseline score. It was determined that she is within the recommended range when compared to her baseline and no further action is needed at this time. She will continue SOZO screenings. These are done every 3 months for 2 years post operatively followed by every 6 months for 2 years, and then annually.     L-DEX FLOWSHEETS                L-DEX LYMPHEDEMA SCREENING    Measurement Type Unilateral     L-DEX MEASUREMENT EXTREMITY Upper Extremity     POSITION  Standing     DOMINANT SIDE Left     At Risk Side Right    BASELINE SCORE (UNILATERAL) 13.2    L-DEX SCORE (UNILATERAL) 10.9    VALUE CHANGE (UNILAT) -2.3

## 2024-01-25 ENCOUNTER — Ambulatory Visit
Admission: RE | Admit: 2024-01-25 | Discharge: 2024-01-25 | Disposition: A | Discharge status: Home / Self Care | Source: Ambulatory Visit | Attending: Radiation Oncology | Admitting: Radiation Oncology

## 2024-01-25 ENCOUNTER — Other Ambulatory Visit: Payer: Self-pay

## 2024-01-25 DIAGNOSIS — Z510 Encounter for antineoplastic radiation therapy: Secondary | ICD-10-CM | POA: Diagnosis not present

## 2024-01-25 LAB — RAD ONC ARIA SESSION SUMMARY
Course Elapsed Days: 14
Plan Fractions Treated to Date: 10
Plan Prescribed Dose Per Fraction: 2.66 Gy
Plan Total Fractions Prescribed: 16
Plan Total Prescribed Dose: 42.56 Gy
Reference Point Dosage Given to Date: 26.6 Gy
Reference Point Session Dosage Given: 2.66 Gy
Session Number: 10

## 2024-01-26 ENCOUNTER — Ambulatory Visit
Admission: RE | Admit: 2024-01-26 | Discharge: 2024-01-26 | Disposition: A | Discharge status: Home / Self Care | Source: Ambulatory Visit | Attending: Radiation Oncology | Admitting: Radiation Oncology

## 2024-01-26 ENCOUNTER — Other Ambulatory Visit: Payer: Self-pay

## 2024-01-26 DIAGNOSIS — Z510 Encounter for antineoplastic radiation therapy: Secondary | ICD-10-CM | POA: Diagnosis not present

## 2024-01-26 LAB — RAD ONC ARIA SESSION SUMMARY
Course Elapsed Days: 15
Plan Fractions Treated to Date: 11
Plan Prescribed Dose Per Fraction: 2.66 Gy
Plan Total Fractions Prescribed: 16
Plan Total Prescribed Dose: 42.56 Gy
Reference Point Dosage Given to Date: 29.26 Gy
Reference Point Session Dosage Given: 2.66 Gy
Session Number: 11

## 2024-01-29 ENCOUNTER — Other Ambulatory Visit: Payer: Self-pay

## 2024-01-29 ENCOUNTER — Ambulatory Visit
Admission: RE | Admit: 2024-01-29 | Discharge: 2024-01-29 | Disposition: A | Discharge status: Home / Self Care | Source: Ambulatory Visit | Attending: Radiation Oncology | Admitting: Radiation Oncology

## 2024-01-29 DIAGNOSIS — Z510 Encounter for antineoplastic radiation therapy: Secondary | ICD-10-CM | POA: Diagnosis not present

## 2024-01-29 LAB — RAD ONC ARIA SESSION SUMMARY
Course Elapsed Days: 18
Plan Fractions Treated to Date: 12
Plan Prescribed Dose Per Fraction: 2.66 Gy
Plan Total Fractions Prescribed: 16
Plan Total Prescribed Dose: 42.56 Gy
Reference Point Dosage Given to Date: 31.92 Gy
Reference Point Session Dosage Given: 2.66 Gy
Session Number: 12

## 2024-01-30 ENCOUNTER — Other Ambulatory Visit: Payer: Self-pay

## 2024-01-30 ENCOUNTER — Inpatient Hospital Stay

## 2024-01-30 ENCOUNTER — Ambulatory Visit
Admission: RE | Admit: 2024-01-30 | Discharge: 2024-01-30 | Disposition: A | Discharge status: Home / Self Care | Source: Ambulatory Visit | Attending: Radiation Oncology | Admitting: Radiation Oncology

## 2024-01-30 DIAGNOSIS — Z510 Encounter for antineoplastic radiation therapy: Secondary | ICD-10-CM | POA: Diagnosis not present

## 2024-01-30 DIAGNOSIS — D0512 Intraductal carcinoma in situ of left breast: Secondary | ICD-10-CM

## 2024-01-30 LAB — CBC (CANCER CENTER ONLY)
HCT: 32.8 % — ABNORMAL LOW (ref 36.0–46.0)
Hemoglobin: 11.3 g/dL — ABNORMAL LOW (ref 12.0–15.0)
MCH: 32.6 pg (ref 26.0–34.0)
MCHC: 34.5 g/dL (ref 30.0–36.0)
MCV: 94.5 fL (ref 80.0–100.0)
Platelet Count: 342 10*3/uL (ref 150–400)
RBC: 3.47 MIL/uL — ABNORMAL LOW (ref 3.87–5.11)
RDW: 12.2 % (ref 11.5–15.5)
WBC Count: 4.7 10*3/uL (ref 4.0–10.5)
nRBC: 0 % (ref 0.0–0.2)

## 2024-01-30 LAB — RAD ONC ARIA SESSION SUMMARY
Course Elapsed Days: 19
Plan Fractions Treated to Date: 13
Plan Prescribed Dose Per Fraction: 2.66 Gy
Plan Total Fractions Prescribed: 16
Plan Total Prescribed Dose: 42.56 Gy
Reference Point Dosage Given to Date: 34.58 Gy
Reference Point Session Dosage Given: 2.66 Gy
Session Number: 13

## 2024-01-31 ENCOUNTER — Other Ambulatory Visit: Payer: Self-pay

## 2024-01-31 ENCOUNTER — Ambulatory Visit
Admission: RE | Admit: 2024-01-31 | Discharge: 2024-01-31 | Disposition: A | Discharge status: Home / Self Care | Source: Ambulatory Visit | Attending: Radiation Oncology | Admitting: Radiation Oncology

## 2024-01-31 DIAGNOSIS — Z510 Encounter for antineoplastic radiation therapy: Secondary | ICD-10-CM | POA: Diagnosis not present

## 2024-01-31 LAB — RAD ONC ARIA SESSION SUMMARY
Course Elapsed Days: 20
Plan Fractions Treated to Date: 14
Plan Prescribed Dose Per Fraction: 2.66 Gy
Plan Total Fractions Prescribed: 16
Plan Total Prescribed Dose: 42.56 Gy
Reference Point Dosage Given to Date: 37.24 Gy
Reference Point Session Dosage Given: 2.66 Gy
Session Number: 14

## 2024-02-01 ENCOUNTER — Ambulatory Visit
Admission: RE | Admit: 2024-02-01 | Discharge: 2024-02-01 | Disposition: A | Discharge status: Home / Self Care | Source: Ambulatory Visit | Attending: Radiation Oncology | Admitting: Radiation Oncology

## 2024-02-01 ENCOUNTER — Other Ambulatory Visit: Payer: Self-pay

## 2024-02-01 DIAGNOSIS — Z510 Encounter for antineoplastic radiation therapy: Secondary | ICD-10-CM | POA: Diagnosis not present

## 2024-02-01 LAB — RAD ONC ARIA SESSION SUMMARY
Course Elapsed Days: 21
Plan Fractions Treated to Date: 15
Plan Prescribed Dose Per Fraction: 2.66 Gy
Plan Total Fractions Prescribed: 16
Plan Total Prescribed Dose: 42.56 Gy
Reference Point Dosage Given to Date: 39.9 Gy
Reference Point Session Dosage Given: 2.66 Gy
Session Number: 15

## 2024-02-02 ENCOUNTER — Ambulatory Visit
Admission: RE | Admit: 2024-02-02 | Discharge: 2024-02-02 | Disposition: A | Discharge status: Home / Self Care | Source: Ambulatory Visit | Attending: Radiation Oncology | Admitting: Radiation Oncology

## 2024-02-02 ENCOUNTER — Other Ambulatory Visit: Payer: Self-pay

## 2024-02-02 DIAGNOSIS — Z510 Encounter for antineoplastic radiation therapy: Secondary | ICD-10-CM | POA: Diagnosis not present

## 2024-02-02 LAB — RAD ONC ARIA SESSION SUMMARY
Course Elapsed Days: 22
Plan Fractions Treated to Date: 16
Plan Prescribed Dose Per Fraction: 2.66 Gy
Plan Total Fractions Prescribed: 16
Plan Total Prescribed Dose: 42.56 Gy
Reference Point Dosage Given to Date: 42.56 Gy
Reference Point Session Dosage Given: 2.66 Gy
Session Number: 16

## 2024-02-05 ENCOUNTER — Ambulatory Visit
Admission: RE | Admit: 2024-02-05 | Discharge: 2024-02-05 | Disposition: A | Discharge status: Home / Self Care | Source: Ambulatory Visit | Attending: Radiation Oncology | Admitting: Radiation Oncology

## 2024-02-05 ENCOUNTER — Other Ambulatory Visit: Payer: Self-pay

## 2024-02-05 DIAGNOSIS — Z510 Encounter for antineoplastic radiation therapy: Secondary | ICD-10-CM | POA: Diagnosis not present

## 2024-02-05 LAB — RAD ONC ARIA SESSION SUMMARY
Course Elapsed Days: 25
Plan Fractions Treated to Date: 1
Plan Prescribed Dose Per Fraction: 2 Gy
Plan Total Fractions Prescribed: 5
Plan Total Prescribed Dose: 10 Gy
Reference Point Dosage Given to Date: 2 Gy
Reference Point Session Dosage Given: 2 Gy
Session Number: 17

## 2024-02-06 ENCOUNTER — Other Ambulatory Visit: Payer: Self-pay

## 2024-02-06 ENCOUNTER — Ambulatory Visit
Admission: RE | Admit: 2024-02-06 | Discharge: 2024-02-06 | Disposition: A | Discharge status: Home / Self Care | Source: Ambulatory Visit | Attending: Radiation Oncology | Admitting: Radiation Oncology

## 2024-02-06 DIAGNOSIS — Z510 Encounter for antineoplastic radiation therapy: Secondary | ICD-10-CM | POA: Diagnosis not present

## 2024-02-06 LAB — RAD ONC ARIA SESSION SUMMARY
Course Elapsed Days: 26
Plan Fractions Treated to Date: 2
Plan Prescribed Dose Per Fraction: 2 Gy
Plan Total Fractions Prescribed: 5
Plan Total Prescribed Dose: 10 Gy
Reference Point Dosage Given to Date: 4 Gy
Reference Point Session Dosage Given: 2 Gy
Session Number: 18

## 2024-02-07 ENCOUNTER — Ambulatory Visit
Admission: RE | Admit: 2024-02-07 | Discharge: 2024-02-07 | Disposition: A | Discharge status: Home / Self Care | Source: Ambulatory Visit | Attending: Radiation Oncology | Admitting: Radiation Oncology

## 2024-02-07 ENCOUNTER — Other Ambulatory Visit: Payer: Self-pay

## 2024-02-07 DIAGNOSIS — Z510 Encounter for antineoplastic radiation therapy: Secondary | ICD-10-CM | POA: Diagnosis not present

## 2024-02-07 LAB — RAD ONC ARIA SESSION SUMMARY
Course Elapsed Days: 27
Plan Fractions Treated to Date: 3
Plan Prescribed Dose Per Fraction: 2 Gy
Plan Total Fractions Prescribed: 5
Plan Total Prescribed Dose: 10 Gy
Reference Point Dosage Given to Date: 6 Gy
Reference Point Session Dosage Given: 2 Gy
Session Number: 19

## 2024-02-08 ENCOUNTER — Ambulatory Visit
Admission: RE | Admit: 2024-02-08 | Discharge: 2024-02-08 | Disposition: A | Discharge status: Home / Self Care | Source: Ambulatory Visit | Attending: Radiation Oncology | Admitting: Radiation Oncology

## 2024-02-08 ENCOUNTER — Other Ambulatory Visit: Payer: Self-pay

## 2024-02-08 DIAGNOSIS — Z510 Encounter for antineoplastic radiation therapy: Secondary | ICD-10-CM | POA: Diagnosis not present

## 2024-02-08 LAB — RAD ONC ARIA SESSION SUMMARY
Course Elapsed Days: 28
Plan Fractions Treated to Date: 4
Plan Prescribed Dose Per Fraction: 2 Gy
Plan Total Fractions Prescribed: 5
Plan Total Prescribed Dose: 10 Gy
Reference Point Dosage Given to Date: 8 Gy
Reference Point Session Dosage Given: 2 Gy
Session Number: 20

## 2024-02-09 ENCOUNTER — Other Ambulatory Visit: Payer: Self-pay

## 2024-02-09 ENCOUNTER — Encounter: Payer: Self-pay | Admitting: *Deleted

## 2024-02-09 ENCOUNTER — Other Ambulatory Visit: Payer: Self-pay | Admitting: *Deleted

## 2024-02-09 ENCOUNTER — Ambulatory Visit
Admission: RE | Admit: 2024-02-09 | Discharge: 2024-02-09 | Disposition: A | Discharge status: Home / Self Care | Source: Ambulatory Visit | Attending: Radiation Oncology | Admitting: Radiation Oncology

## 2024-02-09 DIAGNOSIS — Z510 Encounter for antineoplastic radiation therapy: Secondary | ICD-10-CM | POA: Diagnosis not present

## 2024-02-09 LAB — RAD ONC ARIA SESSION SUMMARY
Course Elapsed Days: 29
Plan Fractions Treated to Date: 5
Plan Prescribed Dose Per Fraction: 2 Gy
Plan Total Fractions Prescribed: 5
Plan Total Prescribed Dose: 10 Gy
Reference Point Dosage Given to Date: 10 Gy
Reference Point Session Dosage Given: 2 Gy
Session Number: 21

## 2024-02-09 MED ORDER — SILVER SULFADIAZINE 1 % EX CREA: 1.0000 | TOPICAL_CREAM | Freq: Two times a day (BID) | CUTANEOUS | 2 refills | Status: AC

## 2024-02-12 ENCOUNTER — Telehealth: Payer: Self-pay | Admitting: *Deleted

## 2024-02-12 ENCOUNTER — Telehealth: Payer: Self-pay

## 2024-02-12 NOTE — Telephone Encounter (Signed)
 Patient states silver  sulfa cream is not helping. Requesting to please send in another medication. She is asking for return callback. Radiology team notified

## 2024-02-12 NOTE — Radiation Completion Notes (Signed)
 Patient Name: Sheena [name], FINKEN MRN: [id number] Date of Birth: 07/22/1981 Referring Physician: ANGELINE LAURA, M.D. Date of Service: 2024-02-12 Radiation Oncologist: Marcey Penton, M.D. 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  She underwent stereotactic biopsy which was positive for ductal carcinoma in situ grade 3 with comedonecrosis.  MRI of her breast showed biopsy-proven left breast DCIS measuring 1.8 cm in greatest dimension there was a second area of similar kinetics 1.2 cm non-mass enhancement in the lower outer quadrant of the left breast.  Biopsy of that area was benign.  Showing stromal fibrosis and fibroma toyed changes.  She underwent underwent a wide local excision of the left breast plus bilateral oncoplastic mastopexy and breast reduction surgery by Dr. Cheryl Crass.  She underwent also wide local excision showing a 13 mm area of again grade 3 ductal carcinoma in situ with expansive median necrosis.  Posterior margin was close at 0.17 mm she did undergo reexcision of that margin which was negative.  There were 6 sentinel lymph nodes examined all negative for metastatic disease.  Tumor was ER positive strongly.  She has done well postoperatively.  She is seen today for radiation oncology opinion she specifically denies breast tenderness cough or bone pain.      ==========DELIVERED PLANS==========  First Treatment Date: 2024-01-11 Last Treatment Date: 2024-02-09   Plan Name: Breast_L_BH Site: Breast, Left Technique:  3D Mode: Photon Dose Per Fraction: 2.66 Gy Prescribed Dose (Delivered / Prescribed): 42.56 Gy / 42.56 Gy Prescribed Fxs (Delivered / Prescribed): 16 / 16   Plan Name: Breast_L_Bst Site: Breast, Left Technique: 3D Mode: Photon Dose Per Fraction: 2 Gy Prescribed Dose (Delivered / Prescribed): 10 Gy / 10 Gy Prescribed Fxs (Delivered / Prescribed): 5 / 5     ==========ON TREATMENT VISIT DATES========== 2024-01-16, 2024-01-23, 2024-01-30, 2024-02-06     ==========UPCOMING VISITS========== 03/13/2024 [facility] RAD ONCOLOGY FOLLOW UP 30 Penton Marcey, MD  03/12/2024 [facility] PLASTIC SURG SPEC FOLLOW UP Lowery Estefana RAMAN, DO  02/26/2024 [facility] MED ONC SOZO SCREEN Georgina Shasta POUR, RN  02/22/2024 [facility] MED ONC EST PT Babara Call, MD        ==========APPENDIX - ON TREATMENT VISIT NOTES==========   See weekly On Treatment Notes in Epic for details in the Media tab (listed as Progress notes on the On Treatment Visit Dates listed above).

## 2024-02-12 NOTE — Telephone Encounter (Signed)
 Called patient regarding skin care of treatment site. Advised patient to stop sivadene cream and continue to apply Aquaphor cream 3 times daily.

## 2024-02-15 ENCOUNTER — Encounter: Payer: Self-pay | Admitting: *Deleted

## 2024-02-15 ENCOUNTER — Encounter: Payer: Self-pay | Admitting: Radiation Oncology

## 2024-02-15 ENCOUNTER — Ambulatory Visit
Admission: RE | Admit: 2024-02-15 | Discharge: 2024-02-15 | Disposition: A | Discharge status: Home / Self Care | Source: Ambulatory Visit | Attending: Radiation Oncology | Admitting: Radiation Oncology

## 2024-02-15 VITALS — BP 93/65 | HR 79 | Temp 98.3°F | Resp 16 | Wt 135.0 lb

## 2024-02-15 DIAGNOSIS — Z510 Encounter for antineoplastic radiation therapy: Secondary | ICD-10-CM | POA: Diagnosis not present

## 2024-02-15 DIAGNOSIS — D0512 Intraductal carcinoma in situ of left breast: Secondary | ICD-10-CM

## 2024-02-15 NOTE — Progress Notes (Signed)
 Radiation Oncology Follow up Note  Name: Sheena [name]   Date:   02/15/2024 MRN:  [id number] DOB: Jan 18, 1981    This 43 y.o. female presents to the clinic today for evaluation of some dry desquamation of the skin in her left axilla and patient recently completing whole breast radiation to her left breast for ER positive ductal carcinoma in situ.  REFERRING PROVIDER: Antonette Angeline ORN, NP  HPI: Patient is a 43 year old female now out shortly after completion of whole breast radiation to her left breast for ER positive ductal carcinoma site to she has had some slight skin breakdown in her left axilla.  She tried Silvadene  cream although states it worsened it she is using some Aquaphor cream.  Also has some minor irritation in the inframammary fold..  COMPLICATIONS OF TREATMENT: present  FOLLOW UP COMPLIANCE: keeps appointments   PHYSICAL EXAM:  BP 93/65   Pulse 79   Temp 98.3 F (36.8 C)   Resp 16   Wt 135 lb (61.2 kg)   BMI 22.47 kg/m  Some slight area of dry desquamation in the left axilla no skin breakdown in the inframammary fold she does have hyperpigmentation of the skin and erythematous changes of the skin noted.  RADIOLOGY RESULTS: No current films for review  PLAN: At this time I asked her to stop all creams and to leave the area alone and keep it exposed to the air is much as possible have assured her this will heal in the next several days.  She can also use Advil  for pain relief.  We are also giving her an excuse to avoid to be excused from work for the next several days.  Will see her back next week for reevaluation.  She knows to call sooner with any complaints I see no evidence of mastitis at this time.  I would like to take this opportunity to thank you for allowing me to participate in the care of your patient.SABRA Marcey Penton, MD

## 2024-02-22 ENCOUNTER — Inpatient Hospital Stay: Attending: Oncology | Admitting: Oncology

## 2024-02-22 ENCOUNTER — Encounter: Payer: Self-pay | Admitting: Oncology

## 2024-02-22 VITALS — BP 110/75 | HR 76 | Temp 97.0°F | Resp 18 | Wt 139.2 lb

## 2024-02-22 DIAGNOSIS — Z79810 Long term (current) use of selective estrogen receptor modulators (SERMs): Secondary | ICD-10-CM | POA: Insufficient documentation

## 2024-02-22 DIAGNOSIS — D0512 Intraductal carcinoma in situ of left breast: Secondary | ICD-10-CM | POA: Diagnosis present

## 2024-02-22 MED ORDER — TAMOXIFEN CITRATE 20 MG PO TABS: 20.0000 mg | ORAL_TABLET | Freq: Every day | ORAL | 3 refills | Status: AC

## 2024-02-22 NOTE — Assessment & Plan Note (Addendum)
 High grade DCIS pTis pN0  ER95% + S/p lumpectomy, SLNB and bilateral breast reduction. Close margin, s/p re-excision- negative.  S/p adjuvant radiation.  Recommend endocrine therapy. Plan 5 years, till July 2030 Side effects of tamoxifen including but not limited to hot flush,  fatigue, retinopathy, thrombosis, uterus endometrial cancer, etc  discussed with patient. Patient voices understanding and is willing to proceed with treatment. Rx was sent to pharmacy.  Patient denies chance of pregnancy.  She is not sexually active.

## 2024-02-22 NOTE — Assessment & Plan Note (Signed)
 Recommend annual Gyn evaluation

## 2024-02-22 NOTE — Progress Notes (Signed)
 Hematology/Oncology Progress note [phone] [phone]        REFERRING PROVIDER: Antonette Angeline ORN, NP    CHIEF COMPLAINTS/PURPOSE OF CONSULTATION:  Left breast DCIS  ASSESSMENT & PLAN:   Cancer Staging  Ductal carcinoma in situ (DCIS) of left breast Staging form: Breast, AJCC 8th Edition - Clinical stage from 09/06/2023: Stage 0 (cTis (DCIS), cN0, cM0, G3) - Signed by Babara Call, MD on 09/06/2023   Ductal carcinoma in situ (DCIS) of left breast High grade DCIS pTis pN0  ER95% + S/p lumpectomy, SLNB and bilateral breast reduction. Close margin, s/p re-excision- negative.  S/p adjuvant radiation.  Recommend endocrine therapy. Plan 5 years, till July 2030 Side effects of tamoxifen including but not limited to hot flush,  fatigue, retinopathy, thrombosis, uterus endometrial cancer, etc  discussed with patient. Patient voices understanding and is willing to proceed with treatment. Rx was sent to pharmacy.   Use of tamoxifen (Nolvadex) Recommend annual Gyn evaluation   Orders Placed This Encounter  Procedures   CBC with Differential (Cancer Center Only)    Standing Status:   Future    Expected Date:   05/24/2024    Expiration Date:   08/22/2024   CMP (Cancer Center only)    Standing Status:   Future    Expected Date:   05/24/2024    Expiration Date:   08/22/2024   Follow up 3 months All questions were answered. The patient knows to call the clinic with any problems, questions or concerns.  Call Babara, MD, PhD [facility] Hematology Oncology 02/22/2024    HISTORY OF PRESENTING ILLNESS:  Sheena [name] 42 y.o. female presents to establish care for left breast high grade DCIS I have reviewed her chart and materials related to her cancer extensively and collaborated history with the patient. Summary of oncologic history is as follows: Oncology History  Ductal carcinoma in situ (DCIS) of left breast  07/31/2023 Mammogram   Screening mammogram -Further  evaluation is suggested for calcifications in the left breast.   08/25/2023 Imaging   Left breast mammogram IMPRESSION: LEFT breast 12 mm group of amorphous calcification in the outer central position is low suspicion for malignancy. Recommend further assessment with stereotactic guided biopsy.   RECOMMENDATION: LEFT breast stereotactic guided biopsy (1 site)   08/31/2023 Initial Diagnosis   Ductal carcinoma in situ (DCIS) of left breast  Patient underwent left breast biopsy  1. Breast, left, needle core biopsy, lower outer :       - HIGH-GRADE DUCTAL CARCINOMA IN SITU (DCIS), COMEDO-TYPE WITH CALCIFICATION    Menarche at age of 73 First live birth at age of 57 OCP use: no Menopausal status: having period  History of HRT use: No History of chest radiation: NO Number of previous breast biopsies:  No    09/06/2023 Cancer Staging   Staging form: Breast, AJCC 8th Edition - Clinical stage from 09/06/2023: Stage 0 (cTis (DCIS), cN0, cM0, G3) - Signed by Babara Call, MD on 09/06/2023 Stage prefix: Initial diagnosis Histologic grading system: 3 grade system    Genetic Testing   Negative/normal genetic testing with [employer]'s 39 gene CancerNext +RNAinsight panel. The [employer] CancerNext+RNAinsight Panel includes sequencing, rearrangement analysis, and RNA analysis for the following 39 genes: APC, ATM, BAP1, BARD1, BMPR1A, BRCA1, BRCA2, BRIP1, CDH1, CDKN2A, CHEK2, FH, FLCN, MET, MLH1, MSH2, MSH6, MUTYH, NF1, NTHL1, PALB2, PMS2, PTEN, RAD51C, RAD51D, SMAD4, STK11, TP53, TSC1, TSC2, and VHL (sequencing and deletion/duplication); AXIN2, HOXB13, MBD4, MSH3, POLD1 and POLE (sequencing only);  EPCAM and GREM1 (deletion/duplication only). Report date 09/23/23.    10/19/2023 Surgery   Patient underwent left breast lumpectomy, with SLNB, and bilateral breast reduction.   1. Breast, lumpectomy, left mass :      - HIGH-GRADE DUCTAL CARCINOMA IN SITU (DCIS) WITH COMEDO NECROSIS AND      CALCIFICATION AND FOCALLY  INVOLVING AN INTRADUCTAL PAPILLOMA.      - SEE CANCER SUMARY BELOW.      - SEPARATE BENIGN SPINDLE CELL PROLIFERATION, SEE NOTE.      - PRIOR BIOPSY SITE CHANGE WITH CLIP.      - SCOUT TAG PRESENT.       2. Lymph node, sentinel, biopsy, left :      - SIX LYMPH NODES NEGATIVE FOR MALIGNANCY (0/6).       3. Breast, partial mastectomy, left, posterior margin :      - FOCAL DUCTAL CARCINOMA IN SITU (DCIS); NEW POSTERIOR MARGIN IS NEGATIVE (0.17      MM TO NEW POSTERIOR MARGIN).      - SCLEROSING ADENOSIS, FIBROADENOMATOID CHANGE, AND USUAL DUCTAL HYPERPLASIA.       4. Breast, partial mastectomy, right lower breast tissue :      - SCLEROSING ADENOSIS AND FIBROADENOMATOID CHANGE.      - NEGATIVE FOR MALIGNANCY.       5. Breast, partial mastectomy, right lateral breast tissue :      - SCLEROSING ADENOSIS AND FIBROADENOMATOID CHANGE.      - NEGATIVE FOR MALIGNANCY.       6. Breast, partial mastectomy, inferior left breast :      - SCLEROSING ADENOSIS AND FIBROADENOMATOID CHANGE.      - NEGATIVE FOR MALIGNANCY.  TUMOR Histologic Type: Ductal carcinoma in situ Size (Extent) of DCIS:  Estimated size (extent) of DCIS is at least 13 mm Nuclear Grade: Grade 3 (high) Necrosis: Present, central (expansive comedonecrosis)  MARGINS Margin Status: All margins negative for DCIS Distance from DCIS to closest margin: 0.17 mm Specify closest margin: Posterior  REGIONAL LYMPH NODES Regional Lymph node Status: All regional lymph nodes negative for tumor Total Number of Lymph Nodes Examined (sentinel and non-sentinel): 6 Number of Sentinel Nodes Examined: 6  DISTANT METASTASIS Distant Site(s) Involved, if applicable (select all that apply): Not applicable  PATHOLOGIC STAGE CLASSIFICATION (pTNM, AJCC 8th Edition) TNM Descriptors: Not applicable pTis (DCIS) Regional Lymph Nodes Modifier: Not applicable pN0 pM - Not applicable   ER 95% positive,   12/07/2023 Surgery   Patient underwent  re-excision  1. Breast, lumpectomy, left posterior margin :       - BENIGN BREAST PARENCHYMA WITH MILD FIBROCYSTIC CHANGE       - NEGATIVE FOR CARCINOMA     01/11/2024 - 02/09/2024 Radiation Therapy   Breast radiation    She tolerated radiation.  Patient presents to discuss adjuvant plan.  No new complaints.  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, hpv+    SURGICAL HISTORY: Past Surgical History:  Procedure Laterality Date   BREAST BIOPSY Left 08/31/2023   left breast stereo calcs, coil clip, path pending   BREAST BIOPSY Left 08/31/2023   MM LT BREAST BX W LOC DEV 1ST LESION IMAGE BX SPEC STEREO GUIDE 08/31/2023 [facility]   BREAST LUMPECTOMY Left 12/07/2023   Procedure: BREAST LUMPECTOMY;  Surgeon:  Tye, Isami, DO;  Location: [facility];  Service: General;  Laterality: Left;   BREAST REDUCTION SURGERY Bilateral 10/19/2023   Procedure: BILATERAL ONCOPLASTIC BREAST REDUCTION;  Surgeon: Lowery Estefana RAMAN, DO;  Location: [facility];  Service: Plastics;  Laterality: Bilateral;   CESAREAN SECTION  2005; 2015   [facility] x2   CESAREAN SECTION     PART MASTECTOMY,RADIO FREQUENCY LOCALIZER,AXILLARY SENTINEL NODE BIOPSY Left 10/19/2023   Procedure: PART MASTECTOMY,RADIO FREQUENCY LOCALIZER,AXILLARY SENTINEL NODE BIOPSY;  Surgeon: Tye Millet, DO;  Location: [facility];  Service: General;  Laterality: Left;    SOCIAL HISTORY: Social History   Socioeconomic History   Marital status: Single    Spouse name: Not on file   Number of children: 2   Years of education: Not on file   Highest education level: Not on file  Occupational History   Not on file  Tobacco Use   Smoking status: Never   Smokeless tobacco: Never  Vaping Use   Vaping status: Never Used  Substance and Sexual Activity   Alcohol use: Never   Drug use: Never   Sexual activity:  Not Currently    Birth control/protection: None  Other Topics Concern   Not on file  Social History Narrative   Lives with daughter and son   Social Drivers of Health   Financial Resource Strain: Low Risk  (07/13/2023)   Received [facility]   Overall Financial Resource Strain (CARDIA)    Difficulty of Paying Living Expenses: Not hard at all  Food Insecurity: No Food Insecurity (07/13/2023)   Received from [facility]   Hunger Vital Sign    Within the past 12 months, you worried that your food would run out before you got the money to buy more.: Never true    Within the past 12 months, the food you bought just didn't last and you didn't have money to get more.: Never true  Transportation Needs: Not on file  Physical Activity: Not on file  Stress: Not on file  Social Connections: Not on file  Intimate Partner Violence: Not on file    FAMILY HISTORY: Family History  Problem Relation Age of Onset   Cancer Mother        breast   Breast cancer Mother 22   Cancer Father 91 - 46       prostate   Prostate cancer Father    Cancer Maternal Aunt 84       metastatic, unknown primary   Cancer Maternal Aunt 5       gallbladder    ALLERGIES:  has no known allergies.  MEDICATIONS:  Current Outpatient Medications  Medication Sig Dispense Refill   acetaminophen  (TYLENOL ) 650 MG CR tablet Take 1 tablet (650 mg total) by mouth every 8 (eight) hours as needed for pain or fever. 30 tablet 0   ferrous sulfate 325 (65 FE) MG tablet Take 325 mg by mouth daily.     ibuprofen  (ADVIL ) 800 MG tablet Take 1 tablet (800 mg total) by mouth every 8 (eight) hours as needed for mild pain (pain score 1-3) or moderate pain (pain score 4-6). 30 tablet 0   silver  sulfADIAZINE  (SILVADENE ) 1 % cream Apply 1 Application topically 2 (two) times daily. 50 g 2   tamoxifen (NOLVADEX) 20 MG tablet Take 1 tablet (20 mg total) by mouth daily. 30 tablet 3   VITAMIN D PO Take  1 capsule by mouth daily.     No current  facility-administered medications for this visit.    Review of Systems  Constitutional:  Negative for appetite change, chills, fatigue and fever.  HENT:   Negative for hearing loss and voice change.   Eyes:  Negative for eye problems.  Respiratory:  Negative for chest tightness and cough.   Cardiovascular:  Negative for chest pain.  Gastrointestinal:  Negative for abdominal distention, abdominal pain and blood in stool.  Endocrine: Negative for hot flashes.  Genitourinary:  Negative for difficulty urinating and frequency.   Musculoskeletal:  Negative for arthralgias.  Skin:  Negative for itching and rash.  Neurological:  Negative for extremity weakness.  Hematological:  Negative for adenopathy.  Psychiatric/Behavioral:  Negative for confusion.      PHYSICAL EXAMINATION: ECOG PERFORMANCE STATUS: 0 - Asymptomatic  Vitals:   02/22/24 0920  BP: 110/75  Pulse: 76  Resp: 18  Temp: (!) 97 F (36.1 C)   Filed Weights   02/22/24 0920  Weight: 139 lb 3.2 oz (63.1 kg)    Physical Exam Constitutional:      General: She is not in acute distress.    Appearance: She is not diaphoretic.  HENT:     Head: Normocephalic and atraumatic.  Eyes:     General: No scleral icterus. Cardiovascular:     Rate and Rhythm: Normal rate and regular rhythm.     Heart sounds: No murmur heard. Pulmonary:     Effort: Pulmonary effort is normal. No respiratory distress.     Breath sounds: No wheezing.  Abdominal:     General: There is no distension.     Palpations: Abdomen is soft.     Tenderness: There is no abdominal tenderness.  Musculoskeletal:        General: Normal range of motion.     Cervical back: Normal range of motion and neck supple.  Skin:    General: Skin is warm and dry.     Findings: No erythema.  Neurological:     Mental Status: She is alert and oriented to person, place, and time. Mental status is at baseline.     Cranial Nerves: No  cranial nerve deficit.     Motor: No abnormal muscle tone.  Psychiatric:        Mood and Affect: Affect normal.     Comments: Tearful    Patient declined breast examination as she was examined by Dr. Tye yesterday  LABORATORY DATA:  I have reviewed the data as listed    Latest Ref Rng & Units 01/30/2024   11:18 AM 01/16/2024   11:15 AM 09/06/2023   12:21 PM  CBC  WBC 4.0 - 10.5 K/uL 4.7  5.3  5.7   Hemoglobin 12.0 - 15.0 g/dL 88.6  87.3  87.1   Hematocrit 36.0 - 46.0 % 32.8  36.5  38.0   Platelets 150 - 400 K/uL 342  337  407       Latest Ref Rng & Units 09/06/2023   12:21 PM 02/04/2021    7:51 AM 01/02/2014    2:10 AM  CMP  Glucose 70 - 99 mg/dL 898  80  96   BUN 6 - 20 mg/dL 15  10  4    Creatinine 0.44 - 1.00 mg/dL 9.23  9.30  9.21   Sodium 135 - 145 mmol/L 135  140  139   Potassium 3.5 - 5.1 mmol/L 4.2  4.1  3.1   Chloride 98 - 111 mmol/L 102  100  103   CO2 22 -  32 mmol/L 24  22  24    Calcium 8.9 - 10.3 mg/dL 9.6  89.8  9.7   Total Protein 6.5 - 8.1 g/dL 8.1  7.7  8.4   Total Bilirubin 0.0 - 1.2 mg/dL 0.9  0.2  0.7   Alkaline Phos 38 - 126 U/L 48  53  76   AST 15 - 41 U/L 19  24  24    ALT 0 - 44 U/L 27  25  23       RADIOGRAPHIC STUDIES: I have personally reviewed the radiological images as listed and agreed with the findings in the report. No results found.

## 2024-02-22 NOTE — Progress Notes (Signed)
 Survivorship Care Plan visit completed.  Treatment summary reviewed and given to patient.  ASCO answers booklet reviewed and given to patient.  CARE program and Cancer Transitions discussed with patient along with other resources cancer center offers to patients and caregivers.  Patient verbalized understanding.

## 2024-02-26 ENCOUNTER — Inpatient Hospital Stay: Admitting: *Deleted

## 2024-02-26 ENCOUNTER — Encounter: Payer: Self-pay | Admitting: *Deleted

## 2024-03-12 ENCOUNTER — Ambulatory Visit: Admitting: Plastic Surgery

## 2024-03-13 ENCOUNTER — Ambulatory Visit: Admitting: Radiation Oncology

## 2024-03-21 ENCOUNTER — Ambulatory Visit: Attending: Radiation Oncology | Admitting: Radiation Oncology

## 2024-03-21 DIAGNOSIS — D0512 Intraductal carcinoma in situ of left breast: Secondary | ICD-10-CM | POA: Insufficient documentation

## 2024-03-21 DIAGNOSIS — Z510 Encounter for antineoplastic radiation therapy: Secondary | ICD-10-CM | POA: Insufficient documentation

## 2024-04-11 ENCOUNTER — Inpatient Hospital Stay: Attending: Oncology | Admitting: *Deleted

## 2024-05-31 ENCOUNTER — Ambulatory Visit: Admitting: Plastic Surgery

## 2024-05-31 ENCOUNTER — Inpatient Hospital Stay

## 2024-05-31 ENCOUNTER — Inpatient Hospital Stay: Admitting: Oncology

## 2024-05-31 ENCOUNTER — Telehealth: Payer: Self-pay | Admitting: Oncology

## 2024-05-31 NOTE — Telephone Encounter (Signed)
 Pt scheduled for lab/MD today 10/10. Pt needed to r/s.   Pt appts r/s and confirmed new date/time with pt.

## 2024-06-25 ENCOUNTER — Ambulatory Visit: Admitting: Plastic Surgery

## 2024-06-25 ENCOUNTER — Encounter: Payer: Self-pay | Admitting: Plastic Surgery

## 2024-06-25 DIAGNOSIS — Z859 Personal history of malignant neoplasm, unspecified: Secondary | ICD-10-CM | POA: Diagnosis not present

## 2024-06-25 DIAGNOSIS — D0512 Intraductal carcinoma in situ of left breast: Secondary | ICD-10-CM

## 2024-06-25 DIAGNOSIS — Z923 Personal history of irradiation: Secondary | ICD-10-CM | POA: Diagnosis not present

## 2024-06-25 DIAGNOSIS — N6459 Other signs and symptoms in breast: Secondary | ICD-10-CM

## 2024-06-25 NOTE — Progress Notes (Signed)
   Subjective:    Patient ID: Sheena [name], female    DOB: 11-11-1980, 43 y.o.   MRN: [id number]  The patient is a 43 year old female here for evaluation of her breast.  In January she was diagnosed with left-sided ductal carcinoma in situ.  She underwent a partial mastectomy with bilateral oncoplastic breast reductions in February 2025.  Since then she got radiation which ended around 3 months ago.  She started noticing some swelling in the left breast that comes and goes.  The breasts are soft and no areas of concern.      Review of Systems  Constitutional: Negative.   Eyes: Negative.   Respiratory: Negative.    Cardiovascular: Negative.   Gastrointestinal: Negative.   Endocrine: Negative.   Genitourinary: Negative.        Objective:   Physical Exam Vitals reviewed.  Constitutional:      Appearance: Normal appearance.  HENT:     Head: Atraumatic.  Cardiovascular:     Rate and Rhythm: Normal rate.     Pulses: Normal pulses.  Skin:    General: Skin is warm.     Capillary Refill: Capillary refill takes less than 2 seconds.  Neurological:     Mental Status: She is alert and oriented to person, place, and time.  Psychiatric:        Mood and Affect: Mood normal.        Behavior: Behavior normal.        Thought Content: Thought content normal.        Judgment: Judgment normal.        Assessment & Plan:     ICD-10-CM   1. Ductal carcinoma in situ (DCIS) of left breast  D05.12       The patient also has some cording in her left arm.  She did physical therapy at 1 time which helped.  Going to recommend that she go ahead with physical therapy again.  Also will plan on trying some compression of the arms to see if that will help and then a sports bra.  I also recommend some scar cream for the right periareolar scar area.

## 2024-07-01 ENCOUNTER — Telehealth: Payer: Self-pay | Admitting: Oncology

## 2024-07-01 ENCOUNTER — Inpatient Hospital Stay

## 2024-07-01 ENCOUNTER — Inpatient Hospital Stay: Admitting: Oncology

## 2024-07-01 NOTE — Assessment & Plan Note (Deleted)
 High grade DCIS pTis pN0  ER95% + S/p lumpectomy, SLNB and bilateral breast reduction. Close margin, s/p re-excision- negative.  S/p adjuvant radiation.  Recommend endocrine therapy. Plan 5 years, till July 2030 Side effects of tamoxifen including but not limited to hot flush,  fatigue, retinopathy, thrombosis, uterus endometrial cancer, etc  discussed with patient. Patient voices understanding and is willing to proceed with treatment. Rx was sent to pharmacy.  Patient denies chance of pregnancy.  She is not sexually active.

## 2024-07-01 NOTE — Telephone Encounter (Signed)
 Pt canceled appts for today 11/10. Stated she is sick and will call back to r/s appts. Appts are canceled and noted.

## 2024-08-27 ENCOUNTER — Ambulatory Visit: Admitting: Plastic Surgery
